# Patient Record
Sex: Female | Born: 1995 | Race: Black or African American | Hispanic: No | Marital: Single | Smoking: Never smoker
Health system: Southern US, Community
[De-identification: ages and names within clinical notes are randomized; demographics above are authoritative.]

## PROBLEM LIST (undated history)

## (undated) ENCOUNTER — Inpatient Hospital Stay (HOSPITAL_COMMUNITY): Payer: Self-pay

## (undated) DIAGNOSIS — D649 Anemia, unspecified: Secondary | ICD-10-CM

## (undated) HISTORY — PX: NO PAST SURGERIES: SHX2092

## (undated) HISTORY — DX: Anemia, unspecified: D64.9

---
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
## 2017-09-19 NOTE — L&D Delivery Note (Signed)
Delivery Note At 2314 a viable female was delivered via  (Presentation: ROA ;  ).  APGAR: 8, 9; weight pending.   Placenta status: spontaneous, complete.  Cord: three vessels  with the following complications: loose nuchal.  Cord pH: not drawn   Anesthesia:  Epidural Episiotomy:  None Lacerations:  None Suture Repair: n/a Est. Blood Loss (mL):  179  Mom to postpartum.  Baby to Couplet care / Skin to Skin.  Janeece RiggersEllis K Ozell Juhasz 02/17/2018, 11:31 PM

## 2017-09-29 ENCOUNTER — Inpatient Hospital Stay (HOSPITAL_COMMUNITY)
Admission: AD | Admit: 2017-09-29 | Discharge: 2017-09-29 | Disposition: A | Discharge status: Home / Self Care | Payer: Medicaid Other | Source: Ambulatory Visit | Attending: Obstetrics & Gynecology | Admitting: Obstetrics & Gynecology

## 2017-09-29 ENCOUNTER — Encounter (HOSPITAL_COMMUNITY): Payer: Self-pay | Admitting: *Deleted

## 2017-09-29 ENCOUNTER — Inpatient Hospital Stay (HOSPITAL_COMMUNITY): Payer: Medicaid Other

## 2017-09-29 DIAGNOSIS — Z3687 Encounter for antenatal screening for uncertain dates: Secondary | ICD-10-CM | POA: Diagnosis not present

## 2017-09-29 DIAGNOSIS — R109 Unspecified abdominal pain: Secondary | ICD-10-CM | POA: Diagnosis not present

## 2017-09-29 DIAGNOSIS — O26892 Other specified pregnancy related conditions, second trimester: Secondary | ICD-10-CM | POA: Diagnosis not present

## 2017-09-29 DIAGNOSIS — R1084 Generalized abdominal pain: Secondary | ICD-10-CM | POA: Diagnosis present

## 2017-09-29 DIAGNOSIS — Z3201 Encounter for pregnancy test, result positive: Secondary | ICD-10-CM | POA: Insufficient documentation

## 2017-09-29 DIAGNOSIS — O26899 Other specified pregnancy related conditions, unspecified trimester: Secondary | ICD-10-CM

## 2017-09-29 DIAGNOSIS — Z3492 Encounter for supervision of normal pregnancy, unspecified, second trimester: Secondary | ICD-10-CM

## 2017-09-29 DIAGNOSIS — Z3A20 20 weeks gestation of pregnancy: Secondary | ICD-10-CM

## 2017-09-29 LAB — URINALYSIS, ROUTINE W REFLEX MICROSCOPIC
BILIRUBIN URINE: NEGATIVE
Glucose, UA: NEGATIVE mg/dL
HGB URINE DIPSTICK: NEGATIVE
KETONES UR: NEGATIVE mg/dL
Leukocytes, UA: NEGATIVE
Nitrite: NEGATIVE
PROTEIN: NEGATIVE mg/dL
SPECIFIC GRAVITY, URINE: 1.02 (ref 1.005–1.030)
pH: 5 (ref 5.0–8.0)

## 2017-09-29 LAB — CBC
HEMATOCRIT: 33.7 % — AB (ref 36.0–46.0)
Hemoglobin: 10.8 g/dL — ABNORMAL LOW (ref 12.0–15.0)
MCH: 27.2 pg (ref 26.0–34.0)
MCHC: 32 g/dL (ref 30.0–36.0)
MCV: 84.9 fL (ref 78.0–100.0)
Platelets: 306 10*3/uL (ref 150–400)
RBC: 3.97 MIL/uL (ref 3.87–5.11)
RDW: 13.4 % (ref 11.5–15.5)
WBC: 11.8 10*3/uL — ABNORMAL HIGH (ref 4.0–10.5)

## 2017-09-29 LAB — WET PREP, GENITAL
Sperm: NONE SEEN
Trich, Wet Prep: NONE SEEN
Yeast Wet Prep HPF POC: NONE SEEN

## 2017-09-29 LAB — HCG, QUANTITATIVE, PREGNANCY: hCG, Beta Chain, Quant, S: 42310 m[IU]/mL — ABNORMAL HIGH (ref <5)

## 2017-09-29 LAB — POCT PREGNANCY, URINE: PREG TEST UR: POSITIVE — AB

## 2017-09-29 MED ORDER — PRENATAL 27-0.8 MG PO TABS: 1.0000 | ORAL_TABLET | Freq: Every day | ORAL | 0 refills | Status: DC

## 2017-09-29 NOTE — Discharge Instructions (Signed)
Abdominal Pain During Pregnancy Abdominal pain is common in pregnancy. Most of the time, it does not cause harm. There are many causes of abdominal pain. Some causes are more serious than others and sometimes the cause is not known. Abdominal pain can be a sign that something is very wrong with the pregnancy or the pain may have nothing to do with the pregnancy. Always tell your health care provider if you have any abdominal pain. Follow these instructions at home:  Do not have sex or put anything in your vagina until your symptoms go away completely.  Watch your abdominal pain for any changes.  Get plenty of rest until your pain improves.  Drink enough fluid to keep your urine clear or pale yellow.  Take over-the-counter or prescription medicines only as told by your health care provider.  Keep all follow-up visits as told by your health care provider. This is important. Contact a health care provider if:  You have a fever.  Your pain gets worse or you have cramping.  Your pain continues after resting. Get help right away if:  You are bleeding, leaking fluid, or passing tissue from the vagina.  You have vomiting or diarrhea that does not go away.  You have painful or bloody urination.  You notice a decrease in your baby's movements.  You feel very weak or faint.  You have shortness of breath.  You develop a severe headache with abdominal pain.  You have abnormal vaginal discharge with abdominal pain. This information is not intended to replace advice given to you by your health care provider. Make sure you discuss any questions you have with your health care provider. Document Released: 09/05/2005 Document Revised: 06/16/2016 Document Reviewed: 04/04/2013 Elsevier Interactive Patient Education  2018 Elsevier Inc.   [place] [facility] Providers    [facility]       Phone: [phone]  [facility]  [place] Phone: [phone]  [facility] [place]  Phone: [phone]  [facility]  Phone: [phone]  [facility] [place]  Phone: [phone]  [facility]       Phone: [phone]  [facility]    Phone: [phone]   [facility]    Phone: [phone]  [facility]    Phone: [phone]  [facility]    Phone: [phone]  [facility]    Phone: [phone]  [facility]       Phone: [phone]   [facility]  Phone: [phone]  [facility]    Phone: [phone]  [facility]   Phone: [phone]  [facility]      Phone: [phone]  [facility]    Phone: [phone]  Safe Medications in Pregnancy   Acne: Benzoyl Peroxide Salicylic Acid  Backache/Headache: Tylenol: 2 regular strength every 4 hours OR              2 Extra strength every 6 hours  Colds/Coughs/Allergies: Benadryl (alcohol free) 25 mg every 6 hours as needed Breath right strips Claritin Cepacol throat lozenges Chloraseptic throat spray Cold-Eeze- up to three times per day Cough drops, alcohol free Flonase (by prescription only) Guaifenesin Mucinex Robitussin DM (plain only, alcohol free) Saline nasal spray/drops Sudafed (pseudoephedrine) & Actifed ** use only after [redacted] weeks gestation and if you  do not have high blood pressure Tylenol Vicks Vaporub Zinc lozenges Zyrtec   Constipation: Colace Ducolax suppositories Fleet enema Glycerin suppositories Metamucil Milk of magnesia Miralax Senokot Smooth move tea  Diarrhea: Kaopectate Imodium A-D  *NO pepto Bismol  Hemorrhoids: Anusol Anusol HC Preparation  H Tucks  Indigestion: Tums Maalox Mylanta Zantac  Pepcid  Insomnia: Benadryl (alcohol free) 25mg  every 6 hours as needed Tylenol PM Unisom, no Gelcaps  Leg Cramps: Tums MagGel  Nausea/Vomiting:  Bonine Dramamine Emetrol Ginger extract Sea bands Meclizine  Nausea medication to take during pregnancy:  Unisom (doxylamine succinate 25 mg tablets) Take one tablet daily at bedtime. If symptoms are not adequately controlled, the dose can be increased to a maximum recommended dose of two tablets daily (1/2 tablet in the morning, 1/2 tablet mid-afternoon and one at bedtime). Vitamin B6 100mg  tablets. Take one tablet twice a day (up to 200 mg per day).  Skin Rashes: Aveeno products Benadryl cream or 25mg  every 6 hours as needed Calamine Lotion 1% cortisone cream  Yeast infection: Gyne-lotrimin 7 Monistat 7   **If taking multiple medications, please check labels to avoid duplicating the same active ingredients **take medication as directed on the label ** Do not exceed 4000 mg of tylenol in 24 hours **Do not take medications that contain aspirin or ibuprofen

## 2017-09-29 NOTE — MAU Note (Signed)
Pt reports she has had abd cramping , LMP 08/09/2017

## 2017-09-29 NOTE — MAU Provider Note (Signed)
History     CSN: [id number]  Arrival date and time: 09/29/17 1739  Chief Complaint  Patient presents with  . Abdominal Pain  . Possible Pregnancy   HPI Alice [name] is a 22 y.o. G1P0 at 3565w0d who presents with generalized abdominal pain. She states the pain comes and goes and she rates it a 5/10, has not tried anything for the pain. She denies any vaginal bleeding or discharge. Denies any nausea, vomiting or diarrhea. She is unsure of her LMP but thinks it was middle of November  OB History    Gravida Para Term Preterm AB Living   1             SAB TAB Ectopic Multiple Live Births                  History reviewed. No pertinent past medical history.  History reviewed. No pertinent surgical history.  History reviewed. No pertinent family history.  Social History   Tobacco Use  . Smoking status: Not on file  Substance Use Topics  . Alcohol use: Not on file  . Drug use: Not on file    Allergies: No Known Allergies  No medications prior to admission.    Review of Systems  Constitutional: Negative.  Negative for fatigue and fever.  HENT: Negative.   Respiratory: Negative.  Negative for shortness of breath.   Cardiovascular: Negative.  Negative for chest pain.  Gastrointestinal: Positive for abdominal pain. Negative for constipation, diarrhea, nausea and vomiting.  Genitourinary: Negative.  Negative for dysuria, vaginal bleeding and vaginal discharge.  Neurological: Negative.  Negative for dizziness and headaches.   Physical Exam   Blood pressure 121/70, pulse 93, temperature 98.2 F (36.8 C), temperature source Oral, resp. rate 16, height 5' 3.5" (1.613 m), weight 121 lb (54.9 kg), last menstrual period 08/09/2017, SpO2 100 %.  Physical Exam  Nursing note and vitals reviewed. Constitutional: She is oriented to person, place, and time. She appears well-developed and well-nourished. No distress.  HENT:  Head: Normocephalic.  Eyes: Pupils are equal, round, and  reactive to light.  Cardiovascular: Normal rate, regular rhythm and normal heart sounds.  Respiratory: Effort normal and breath sounds normal. No respiratory distress.  GI: Soft. Bowel sounds are normal. She exhibits no distension. There is no tenderness.  Neurological: She is alert and oriented to person, place, and time.  Skin: Skin is warm and dry.  Psychiatric: She has a normal mood and affect. Her behavior is normal. Judgment and thought content normal.   Dilation: Closed Effacement (%): Thick Exam by:: caroline neil cnm   MAU Course  Procedures Results for orders placed or performed during the hospital encounter of 09/29/17 (from the past 24 hour(s))  Urinalysis, Routine w reflex microscopic     Status: Abnormal   Collection Time: 09/29/17  6:11 PM  Result Value Ref Range   Color, Urine YELLOW YELLOW   APPearance HAZY (A) CLEAR   Specific Gravity, Urine 1.020 1.005 - 1.030   pH 5.0 5.0 - 8.0   Glucose, UA NEGATIVE NEGATIVE mg/dL   Hgb urine dipstick NEGATIVE NEGATIVE   Bilirubin Urine NEGATIVE NEGATIVE   Ketones, ur NEGATIVE NEGATIVE mg/dL   Protein, ur NEGATIVE NEGATIVE mg/dL   Nitrite NEGATIVE NEGATIVE   Leukocytes, UA NEGATIVE NEGATIVE  Pregnancy, urine POC     Status: Abnormal   Collection Time: 09/29/17  6:16 PM  Result Value Ref Range   Preg Test, Ur POSITIVE (A) NEGATIVE  CBC  Status: Abnormal   Collection Time: 09/29/17  6:39 PM  Result Value Ref Range   WBC 11.8 (H) 4.0 - 10.5 K/uL   RBC 3.97 3.87 - 5.11 MIL/uL   Hemoglobin 10.8 (L) 12.0 - 15.0 g/dL   HCT 16.1 (L) 09.6 - 04.5 %   MCV 84.9 78.0 - 100.0 fL   MCH 27.2 26.0 - 34.0 pg   MCHC 32.0 30.0 - 36.0 g/dL   RDW 40.9 81.1 - 91.4 %   Platelets 306 150 - 400 K/uL  hCG, quantitative, pregnancy     Status: Abnormal   Collection Time: 09/29/17  6:39 PM  Result Value Ref Range   hCG, Beta Chain, Quant, S 42,310 (H) <5 mIU/mL  ABO/Rh     Status: None (Preliminary result)   Collection Time: 09/29/17  6:39  PM  Result Value Ref Range   ABO/RH(D) O POS      MDM UA, UPT CBC, HCG, ABO/Rh Wet prep and gc/chlamydia [place] MFM OB Limited- u/s shows IUP at 20 weeks with posterior placenta   Assessment and Plan   1. Normal IUP (intrauterine pregnancy) on prenatal ultrasound, second trimester   2. Abdominal pain affecting pregnancy    -Discharge home in stable condition -Rx for prenatal vitamins given to patient -Patient advised to follow-up with OB/GYN of choice to start prenatal care asap, list given -Patient may return to MAU as needed or if her condition were to change or worsen   Rolm Bookbinder CNM 09/29/2017, 7:31 PM

## 2017-09-30 LAB — ABO/RH: ABO/RH(D): O POS

## 2017-09-30 LAB — GC/CHLAMYDIA PROBE AMP (~~LOC~~) NOT AT ARMC
CHLAMYDIA, DNA PROBE: NEGATIVE
NEISSERIA GONORRHEA: NEGATIVE

## 2017-10-10 LAB — OB RESULTS CONSOLE ABO/RH: RH Type: POSITIVE

## 2017-10-10 LAB — OB RESULTS CONSOLE RUBELLA ANTIBODY, IGM: RUBELLA: IMMUNE

## 2017-10-10 LAB — OB RESULTS CONSOLE RPR: RPR: NONREACTIVE

## 2017-10-10 LAB — OB RESULTS CONSOLE GC/CHLAMYDIA
Chlamydia: NEGATIVE
GC PROBE AMP, GENITAL: NEGATIVE

## 2017-10-10 LAB — OB RESULTS CONSOLE ANTIBODY SCREEN: ANTIBODY SCREEN: NEGATIVE

## 2017-10-10 LAB — OB RESULTS CONSOLE HEPATITIS B SURFACE ANTIGEN: Hepatitis B Surface Ag: NEGATIVE

## 2017-10-10 LAB — OB RESULTS CONSOLE HIV ANTIBODY (ROUTINE TESTING): HIV: NONREACTIVE

## 2018-01-17 LAB — OB RESULTS CONSOLE GBS: GBS: POSITIVE

## 2018-02-16 ENCOUNTER — Encounter (HOSPITAL_COMMUNITY): Payer: Self-pay | Admitting: *Deleted

## 2018-02-16 ENCOUNTER — Other Ambulatory Visit: Payer: Self-pay | Admitting: Obstetrics and Gynecology

## 2018-02-16 ENCOUNTER — Telehealth (HOSPITAL_COMMUNITY): Payer: Self-pay | Admitting: *Deleted

## 2018-02-16 NOTE — Telephone Encounter (Signed)
Preadmission screen  

## 2018-02-17 ENCOUNTER — Inpatient Hospital Stay (HOSPITAL_COMMUNITY)
Admission: AD | Admit: 2018-02-17 | Discharge: 2018-02-19 | DRG: 807 | Disposition: A | Discharge status: Home / Self Care | Payer: Medicaid Other | Attending: Obstetrics and Gynecology | Admitting: Obstetrics and Gynecology

## 2018-02-17 ENCOUNTER — Inpatient Hospital Stay (HOSPITAL_COMMUNITY): Payer: Medicaid Other | Admitting: Anesthesiology

## 2018-02-17 ENCOUNTER — Other Ambulatory Visit: Payer: Self-pay

## 2018-02-17 ENCOUNTER — Encounter (HOSPITAL_COMMUNITY): Payer: Self-pay

## 2018-02-17 DIAGNOSIS — D649 Anemia, unspecified: Secondary | ICD-10-CM | POA: Diagnosis present

## 2018-02-17 DIAGNOSIS — O9902 Anemia complicating childbirth: Secondary | ICD-10-CM | POA: Diagnosis present

## 2018-02-17 DIAGNOSIS — Z3A40 40 weeks gestation of pregnancy: Secondary | ICD-10-CM

## 2018-02-17 DIAGNOSIS — Z880 Allergy status to penicillin: Secondary | ICD-10-CM

## 2018-02-17 DIAGNOSIS — O99824 Streptococcus B carrier state complicating childbirth: Secondary | ICD-10-CM | POA: Diagnosis present

## 2018-02-17 DIAGNOSIS — Z3483 Encounter for supervision of other normal pregnancy, third trimester: Secondary | ICD-10-CM | POA: Diagnosis present

## 2018-02-17 LAB — POCT FERN TEST: POCT Fern Test: NEGATIVE

## 2018-02-17 LAB — TYPE AND SCREEN
ABO/RH(D): O POS
Antibody Screen: NEGATIVE

## 2018-02-17 LAB — CBC
HCT: 33.9 % — ABNORMAL LOW (ref 36.0–46.0)
HEMOGLOBIN: 10.5 g/dL — AB (ref 12.0–15.0)
MCH: 24.9 pg — AB (ref 26.0–34.0)
MCHC: 31 g/dL (ref 30.0–36.0)
MCV: 80.3 fL (ref 78.0–100.0)
Platelets: 249 10*3/uL (ref 150–400)
RBC: 4.22 MIL/uL (ref 3.87–5.11)
RDW: 14.3 % (ref 11.5–15.5)
WBC: 10.1 10*3/uL (ref 4.0–10.5)

## 2018-02-17 MED ORDER — FENTANYL 2.5 MCG/ML BUPIVACAINE 1/10 % EPIDURAL INFUSION (WH - ANES)
14.0000 mL/h | INTRAMUSCULAR | Status: DC | PRN
  Administered 2018-02-17: 14 mL/h via EPIDURAL

## 2018-02-17 MED ORDER — LIDOCAINE HCL (PF) 1 % IJ SOLN
INTRAMUSCULAR | Status: DC | PRN
  Administered 2018-02-17: 5 mL via EPIDURAL

## 2018-02-17 MED ORDER — PHENYLEPHRINE 40 MCG/ML (10ML) SYRINGE FOR IV PUSH (FOR BLOOD PRESSURE SUPPORT): 80.0000 ug | PREFILLED_SYRINGE | INTRAVENOUS | Status: DC | PRN

## 2018-02-17 MED ORDER — FENTANYL 2.5 MCG/ML BUPIVACAINE 1/10 % EPIDURAL INFUSION (WH - ANES)
INTRAMUSCULAR | Status: AC
  Filled 2018-02-17: qty 100

## 2018-02-17 MED ORDER — OXYTOCIN 40 UNITS IN LACTATED RINGERS INFUSION - SIMPLE MED
2.5000 [IU]/h | INTRAVENOUS | Status: DC
  Filled 2018-02-17: qty 1000

## 2018-02-17 MED ORDER — PENICILLIN G POT IN DEXTROSE 60000 UNIT/ML IV SOLN
3.0000 10*6.[IU] | INTRAVENOUS | Status: DC
  Administered 2018-02-17: 3 10*6.[IU] via INTRAVENOUS
  Filled 2018-02-17 (×5): qty 50

## 2018-02-17 MED ORDER — FENTANYL CITRATE (PF) 100 MCG/2ML IJ SOLN
INTRAMUSCULAR | Status: AC
  Filled 2018-02-17: qty 2

## 2018-02-17 MED ORDER — EPHEDRINE 5 MG/ML INJ
10.0000 mg | INTRAVENOUS | Status: DC | PRN
  Filled 2018-02-17: qty 2

## 2018-02-17 MED ORDER — PHENYLEPHRINE 40 MCG/ML (10ML) SYRINGE FOR IV PUSH (FOR BLOOD PRESSURE SUPPORT)
80.0000 ug | PREFILLED_SYRINGE | INTRAVENOUS | Status: DC | PRN
  Filled 2018-02-17: qty 5

## 2018-02-17 MED ORDER — PHENYLEPHRINE 40 MCG/ML (10ML) SYRINGE FOR IV PUSH (FOR BLOOD PRESSURE SUPPORT)
PREFILLED_SYRINGE | INTRAVENOUS | Status: AC
  Filled 2018-02-17: qty 10

## 2018-02-17 MED ORDER — ONDANSETRON HCL 4 MG/2ML IJ SOLN: 4.0000 mg | Freq: Four times a day (QID) | INTRAMUSCULAR | Status: DC | PRN

## 2018-02-17 MED ORDER — LACTATED RINGERS IV SOLN: 500.0000 mL | INTRAVENOUS | Status: DC | PRN

## 2018-02-17 MED ORDER — FENTANYL CITRATE (PF) 100 MCG/2ML IJ SOLN
100.0000 ug | INTRAMUSCULAR | Status: DC | PRN
  Administered 2018-02-17: 100 ug via INTRAVENOUS

## 2018-02-17 MED ORDER — OXYCODONE-ACETAMINOPHEN 5-325 MG PO TABS: 1.0000 | ORAL_TABLET | ORAL | Status: DC | PRN

## 2018-02-17 MED ORDER — LIDOCAINE HCL (PF) 1 % IJ SOLN
30.0000 mL | INTRAMUSCULAR | Status: DC | PRN
  Filled 2018-02-17: qty 30

## 2018-02-17 MED ORDER — SOD CITRATE-CITRIC ACID 500-334 MG/5ML PO SOLN: 30.0000 mL | ORAL | Status: DC | PRN

## 2018-02-17 MED ORDER — ACETAMINOPHEN 325 MG PO TABS: 650.0000 mg | ORAL_TABLET | ORAL | Status: DC | PRN

## 2018-02-17 MED ORDER — EPHEDRINE 5 MG/ML INJ: 10.0000 mg | INTRAVENOUS | Status: DC | PRN

## 2018-02-17 MED ORDER — LACTATED RINGERS IV SOLN
500.0000 mL | Freq: Once | INTRAVENOUS | Status: AC
  Administered 2018-02-17: 500 mL via INTRAVENOUS

## 2018-02-17 MED ORDER — SODIUM CHLORIDE 0.9 % IV SOLN
5.0000 10*6.[IU] | Freq: Once | INTRAVENOUS | Status: AC
  Administered 2018-02-17: 5 10*6.[IU] via INTRAVENOUS
  Filled 2018-02-17: qty 5

## 2018-02-17 MED ORDER — OXYCODONE-ACETAMINOPHEN 5-325 MG PO TABS: 2.0000 | ORAL_TABLET | ORAL | Status: DC | PRN

## 2018-02-17 MED ORDER — OXYTOCIN BOLUS FROM INFUSION
500.0000 mL | Freq: Once | INTRAVENOUS | Status: AC
  Administered 2018-02-17: 500 mL via INTRAVENOUS

## 2018-02-17 MED ORDER — LACTATED RINGERS IV SOLN
INTRAVENOUS | Status: DC
  Administered 2018-02-17: 18:00:00 via INTRAVENOUS

## 2018-02-17 MED ORDER — DIPHENHYDRAMINE HCL 50 MG/ML IJ SOLN: 12.5000 mg | INTRAMUSCULAR | Status: DC | PRN

## 2018-02-17 MED ORDER — LACTATED RINGERS IV SOLN: 500.0000 mL | Freq: Once | INTRAVENOUS | Status: DC

## 2018-02-17 NOTE — Anesthesia Procedure Notes (Signed)
Epidural Patient location during procedure: OB Start time: 02/17/2018 8:48 PM End time: 02/17/2018 8:54 PM  Staffing Anesthesiologist: Shelton SilvasHollis, Keiondra Brookover D, MD Performed: anesthesiologist   Preanesthetic Checklist Completed: patient identified, site marked, surgical consent, pre-op evaluation, timeout performed, IV checked, risks and benefits discussed and monitors and equipment checked  Epidural Patient position: sitting Prep: ChloraPrep Patient monitoring: heart rate, continuous pulse ox and blood pressure Approach: midline Location: L3-L4 Injection technique: LOR saline  Needle:  Needle type: Tuohy  Needle gauge: 17 G Needle length: 9 cm Catheter type: closed end flexible Catheter size: 20 Guage Test dose: negative and 1.5% lidocaine  Assessment Events: blood not aspirated, injection not painful, no injection resistance and no paresthesia  Additional Notes LOR @ 4  Patient identified. Risks/Benefits/Options discussed with patient including but not limited to bleeding, infection, nerve damage, paralysis, failed block, incomplete pain control, headache, blood pressure changes, nausea, vomiting, reactions to medications, itching and postpartum back pain. Confirmed with bedside nurse the patient's most recent platelet count. Confirmed with patient that they are not currently taking any anticoagulation, have any bleeding history or any family history of bleeding disorders. Patient expressed understanding and wished to proceed. All questions were answered. Sterile technique was used throughout the entire procedure. Please see nursing notes for vital signs. Test dose was given through epidural catheter and negative prior to continuing to dose epidural or start infusion. Warning signs of high block given to the patient including shortness of breath, tingling/numbness in hands, complete motor block, or any concerning symptoms with instructions to call for help. Patient was given instructions on  fall risk and not to get out of bed. All questions and concerns addressed with instructions to call with any issues or inadequate analgesia.    Reason for block:procedure for pain

## 2018-02-17 NOTE — Anesthesia Preprocedure Evaluation (Signed)
Anesthesia Evaluation  Patient identified by MRN, date of birth, ID band Patient awake    Reviewed: Allergy & Precautions, Patient's Chart, lab work & pertinent test results  Airway Mallampati: I       Dental no notable dental hx.    Pulmonary neg pulmonary ROS,    Pulmonary exam normal        Cardiovascular negative cardio ROS Normal cardiovascular exam     Neuro/Psych negative neurological ROS     GI/Hepatic negative GI ROS,   Endo/Other  negative endocrine ROS  Renal/GU      Musculoskeletal   Abdominal   Peds  Hematology  (+) anemia ,   Anesthesia Other Findings   Reproductive/Obstetrics (+) Pregnancy                             Anesthesia Physical Anesthesia Plan  ASA: II  Anesthesia Plan: Epidural   Post-op Pain Management:    Induction:   PONV Risk Score and Plan:   Airway Management Planned: Natural Airway  Additional Equipment: None  Intra-op Plan:   Post-operative Plan:   Informed Consent: I have reviewed the patients History and Physical, chart, labs and discussed the procedure including the risks, benefits and alternatives for the proposed anesthesia with the patient or authorized representative who has indicated his/her understanding and acceptance.     Plan Discussed with:   Anesthesia Plan Comments: (Lab Results      Component                Value               Date                      WBC                      10.1                02/17/2018                HGB                      10.5 (L)            02/17/2018                HCT                      33.9 (L)            02/17/2018                MCV                      80.3                02/17/2018                PLT                      249                 02/17/2018           )        Anesthesia Quick Evaluation

## 2018-02-17 NOTE — Consult Note (Signed)
Neonatology Note:   Attendance at Delivery:    [facility] for Dr. Estanislado Pandyivard to attend this NSVD at term due to meconium-stained fluid. The mother is a G1P0 O pos, GBS positive with late PNC starting at 20 weeks. She was treated with Pen G > 4 hours before delivery and was afebrile. She also got 1 dose of IV fentanyl 4 hours before delivery. ROM 2 hours prior to delivery, fluid with thin meconium. There were a few late FHR decelerations with last pushes. Infant was bulb suctioned on her mother's abdomen, then began to cry with stimulation. Delayed cord clamping was done. Needed only minimal bulb suctioning. Ap 8/9. Lungs clear to ausc in DR. Infant is able to remain with her mother for skin to skin time under nursing supervision. Transferred to the care of Pediatrician.   Doretha Souhristie C. Lan Entsminger, MD

## 2018-02-17 NOTE — H&P (Addendum)
Alice [name] is a 22 y.o. female, G1P0, IUP at 40.1 weeks, presenting for Spontaneous labor. Pregnancy complicated by GBS positive, late entry to PNV at 20 weeks, and anemia, hgb 10.6 at NOB visit, not low enough for tx. Pt stated she started feeling regular contraction on 02/16/2018 around 1200, then today at 1400 she lost her mucous plug and felt a trickle, pt thought her water had broken. Pt endorses feeling contractions Q5M, regular, and able to pace breath through them, but painful.  Pt endorse + Fm. Denies vaginal bleeding.   There are no active problems to display for this patient.   Past Medical History:  Diagnosis Date  . Anemia      No current facility-administered medications on file prior to encounter.    Current Outpatient Medications on File Prior to Encounter  Medication Sig Dispense Refill  . Prenatal Vit-Fe Fumarate-FA (MULTIVITAMIN-PRENATAL) 27-0.8 MG TABS tablet Take 1 tablet by mouth daily at 12 noon. 30 each 0     No Known Allergies  History of present pregnancy: Pt Info/Preference:  Screening/Consents:  Labs:   EDD: Estimated Date of Delivery: 02/16/18  Establised: Patient's last menstrual period was 08/09/2017.  Anatomy Scan: Date: 22.3 Placenta Location: Posterior  Genetic Screen: Panoroma: Normal, BG AFP:  First Tri: Quad:  Office: [facility] First PNV: 21.4 Blood Type O/Positive/-- (01/22 0000)  Language: [place] Last PNV: 39.5 Rhogam    Flu Vaccine:  Declined   Antibody Negative (01/22 0000)  TDaP vaccine Decline   GTT: Early: N/A Third Trimester: Neg  Feeding Plan: Breast BTL: N/A Rubella: Immune (01/22 0000)  Contraception: Undecided VBAC: N/A RPR: Nonreactive (01/22 0000)   Circumcision: BG/NO   HBsAg: Negative (01/22 0000)  Pediatrician:  Undecided    HIV: Non-reactive (01/22 0000)   Prenatal Classes: None Additional [place]: None GBS: Positive (05/01 0000)(For PCN allergy, check sensitivities)       Chlamydia: Neg      GC: Neg  Support Person: Partner:  Basir   PAP: 2019 Neg      Hgb Electrophoresis:  AA  Pain Management:  Natural Labor   Hgb NOB: 10.6    28W: 10.1   OB History    Gravida  1   Para      Term      Preterm      AB      Living        SAB      TAB      Ectopic      Multiple      Live Births             Past Medical History:  Diagnosis Date  . Anemia    History reviewed. No pertinent surgical history. Family History: family history is not on file. Social History:  reports that she has never smoked. She has never used smokeless tobacco. She reports that she does not drink alcohol or use drugs.   Prenatal Transfer Tool  Maternal Diabetes: No Genetic Screening: Normal Maternal Ultrasounds/Referrals: Normal Fetal Ultrasounds or other Referrals:  None Maternal Substance Abuse:  No Significant Maternal Medications:  None Significant Maternal Lab Results: None  ROS:  Review of Systems  Gastrointestinal: Positive for abdominal pain and nausea.  All other systems reviewed and are negative.    Physical Exam: BP 125/82 (BP Location: Right Arm)   Pulse 100   Temp 98.6 F (37 C) (Oral)   Resp 19   Ht 5' 3.5" (1.613 m)  Wt 67.6 kg (149 lb)   LMP 08/09/2017   SpO2 100%   BMI 25.98 kg/m   Physical Exam  Constitutional: She is well-developed, well-nourished, and in no distress.  HENT:  Head: Normocephalic and atraumatic.  Eyes: Pupils are equal, round, and reactive to light. Conjunctivae are normal.  Neck: Normal range of motion. Neck supple.  Cardiovascular: Normal rate, regular rhythm and normal heart sounds.  Pulmonary/Chest: Effort normal.  Abdominal: Soft. Bowel sounds are normal.  Genitourinary:  Genitourinary Comments: Per RN; see SVE Uterus: gradia equal to dates. Soft during no contractions and moderate to palpate during contractions.  Deferred Vaginal exam/.      NST: FHR baseline 135 bpm, Variability: moderate, Accelerations:present, Decelerations:  Absent= Cat 1/Reactive UC:    irregular, every 4-5 minutes, lasting 1-2 mins SVE: PER RN SVE:   Dilation: 4.5 Effacement (%): 90 Station: -2 Exam by:: n druebbisch rn, vertex verified by bedside US.  Leopold's: Position vertex, EFW 7.5 via leopold's.    In-Pt labs: No results found for this or any previous visit (from the past 24 hour(s)).    Assessment/Plan: Alice [name] is a 22 y.o. female, G1P0, IUP at 40.1 weeks, presenting for spontaneous labor.   FWB: Cat 1 Fetal Tracing.   Plan: Admit to Birthing Suite per consult with Dr Estanislado Pandyivard Routine [facility] orders Pain med: Database administratoratural Labor, will continue to assess pts need PCN G for GBS prophylaxis Anticipate labor progression   Quran Vasco NP-C, CNM, MSN 02/17/2018, 5:14 PM

## 2018-02-17 NOTE — MAU Note (Signed)
Pt leaking fluid since 1400, contractions

## 2018-02-18 ENCOUNTER — Encounter (HOSPITAL_COMMUNITY): Payer: Self-pay

## 2018-02-18 LAB — RPR: RPR Ser Ql: NONREACTIVE

## 2018-02-18 LAB — CBC
HEMATOCRIT: 30.9 % — AB (ref 36.0–46.0)
HEMOGLOBIN: 9.7 g/dL — AB (ref 12.0–15.0)
MCH: 25 pg — ABNORMAL LOW (ref 26.0–34.0)
MCHC: 31.4 g/dL (ref 30.0–36.0)
MCV: 79.6 fL (ref 78.0–100.0)
Platelets: 223 10*3/uL (ref 150–400)
RBC: 3.88 MIL/uL (ref 3.87–5.11)
RDW: 14.3 % (ref 11.5–15.5)
WBC: 14.7 10*3/uL — ABNORMAL HIGH (ref 4.0–10.5)

## 2018-02-18 MED ORDER — BENZOCAINE-MENTHOL 20-0.5 % EX AERO
1.0000 "application " | INHALATION_SPRAY | CUTANEOUS | Status: DC | PRN
  Administered 2018-02-18: 1 via TOPICAL
  Filled 2018-02-18: qty 56

## 2018-02-18 MED ORDER — IBUPROFEN 600 MG PO TABS
600.0000 mg | ORAL_TABLET | Freq: Four times a day (QID) | ORAL | Status: DC
  Administered 2018-02-18 – 2018-02-19 (×3): 600 mg via ORAL
  Filled 2018-02-18 (×6): qty 1

## 2018-02-18 MED ORDER — COCONUT OIL OIL: 1.0000 "application " | TOPICAL_OIL | Status: DC | PRN

## 2018-02-18 MED ORDER — DIPHENHYDRAMINE HCL 25 MG PO CAPS: 25.0000 mg | ORAL_CAPSULE | Freq: Four times a day (QID) | ORAL | Status: DC | PRN

## 2018-02-18 MED ORDER — ONDANSETRON HCL 4 MG PO TABS
4.0000 mg | ORAL_TABLET | ORAL | Status: DC | PRN
Start: 2018-02-18 — End: 2018-02-19

## 2018-02-18 MED ORDER — WITCH HAZEL-GLYCERIN EX PADS: 1.0000 "application " | MEDICATED_PAD | CUTANEOUS | Status: DC | PRN

## 2018-02-18 MED ORDER — PRENATAL MULTIVITAMIN CH
1.0000 | ORAL_TABLET | Freq: Every day | ORAL | Status: DC
  Administered 2018-02-18 – 2018-02-19 (×2): 1 via ORAL
  Filled 2018-02-18 (×2): qty 1

## 2018-02-18 MED ORDER — ONDANSETRON HCL 4 MG/2ML IJ SOLN: 4.0000 mg | INTRAMUSCULAR | Status: DC | PRN

## 2018-02-18 MED ORDER — DIBUCAINE 1 % RE OINT: 1.0000 "application " | TOPICAL_OINTMENT | RECTAL | Status: DC | PRN

## 2018-02-18 MED ORDER — SIMETHICONE 80 MG PO CHEW: 80.0000 mg | CHEWABLE_TABLET | ORAL | Status: DC | PRN

## 2018-02-18 MED ORDER — SENNOSIDES-DOCUSATE SODIUM 8.6-50 MG PO TABS
2.0000 | ORAL_TABLET | ORAL | Status: DC
  Administered 2018-02-18: 2 via ORAL
  Filled 2018-02-18: qty 2

## 2018-02-18 MED ORDER — TETANUS-DIPHTH-ACELL PERTUSSIS 5-2.5-18.5 LF-MCG/0.5 IM SUSP: 0.5000 mL | Freq: Once | INTRAMUSCULAR | Status: DC

## 2018-02-18 MED ORDER — ACETAMINOPHEN 325 MG PO TABS: 650.0000 mg | ORAL_TABLET | ORAL | Status: DC | PRN

## 2018-02-18 MED ORDER — ZOLPIDEM TARTRATE 5 MG PO TABS: 5.0000 mg | ORAL_TABLET | Freq: Every evening | ORAL | Status: DC | PRN

## 2018-02-18 NOTE — Lactation Note (Signed)
This note was copied from a baby's chart. Lactation Consultation Note  Patient Name: Alice [name]: 02/18/2018 Reason for consult: Initial assessment;Primapara;1st time breastfeeding;Term  P1 mother whose infant is now 333 hours old.  Mother is breastfeeding as I arrived.  Infant is latched on the left breast in the cradle hold and actively sucking.  Lips are flanged and mother states no pain.  She feels like she has latched well since birth.  I reminded her to keep the infant's cheeks and nose in tight to the breast for a good latch as it appears the baby might be a little bit shallow.  Mother readjusted the position of her baby.  Encouraged to feed 8-12 times/24 hours or earlier if baby shows feeding cues.  Reviewed feeding cues.  Continue STS and emphasized that father can also do STS.  Reviewed hand expression after feedings and mother will collect any EBM she obtains and feed back to baby.  Mom made aware of O/P services, breastfeeding support groups, community resources, and our phone # for post-discharge questions. Mother will call for assistance as needed.  FIOB and grandfather present.  RN in room for assessment.   Maternal Data Formula Feeding for Exclusion: No Has patient been taught Hand Expression?: Yes Does the patient have breastfeeding experience prior to this delivery?: No  Feeding Feeding Type: Breast Fed Length of feed: 10 min(still feeding)  LATCH Score Latch: Grasps breast easily, tongue down, lips flanged, rhythmical sucking.  Audible Swallowing: None  Type of Nipple: Everted at rest and after stimulation  Comfort (Breast/Nipple): Soft / non-tender  Hold (Positioning): No assistance needed to correctly position infant at breast.  LATCH Score: 8  Interventions Interventions: Breast feeding basics reviewed;Assisted with latch;Skin to skin;Breast massage;Hand express;Position options;Support pillows;Adjust position;Breast  compression  Lactation Tools Discussed/Used     Consult Status Consult Status: Follow-up [name]: 02/19/18 Follow-up type: In-patient    Lupe Handley R Dezaray Shibuya 02/18/2018, 3:00 AM

## 2018-02-18 NOTE — Anesthesia Postprocedure Evaluation (Signed)
Anesthesia Post Note  Patient: Alice [name]  Procedure(s) Performed: AN AD HOC LABOR EPIDURAL     Patient location during evaluation: Mother Baby Anesthesia Type: Epidural Level of consciousness: awake and alert Pain management: pain level controlled Vital Signs Assessment: post-procedure vital signs reviewed and stable Respiratory status: spontaneous breathing, nonlabored ventilation and respiratory function stable Cardiovascular status: stable Postop Assessment: no headache, no backache, epidural receding and patient able to bend at knees Anesthetic complications: no    Last Vitals:  Vitals:   02/18/18 0250 02/18/18 0650  BP: (!) 135/95 115/79  Pulse: 86 88  Resp: 16 16  Temp: 36.9 C 37 C  SpO2: 100% 98%    Last Pain:  Vitals:   02/18/18 0650  TempSrc: Oral  PainSc: 0-No pain   Pain Goal: Patients Stated Pain Goal: 10 (02/17/18 1820)               Rica RecordsICKELTON,Vito Beg

## 2018-02-18 NOTE — Progress Notes (Signed)
Subjective: Postpartum Day 1: Vaginal delivery, no laceration Patient up ad lib, reports no syncope or dizziness. Feeding:  [place] Contraceptive plan:  Mini-Pill  Pt found resting in bed in stable condition justr finishing with breastfeeding, pt mother at bedside. Pt ambulating well, tolerating PO feeds, +passing gas, no problems urinating, pt denies cramps, not needing to take meds. Pt remains stable.   Objective: Vital signs in last 24 hours: Temp:  [98.3 F (36.8 C)-99.3 F (37.4 C)] 98.3 F (36.8 C) (06/02 1211) Pulse Rate:  [86-124] 98 (06/02 1211) Resp:  [15-19] 15 (06/02 1211) BP: (107-141)/(50-101) 111/63 (06/02 1211) SpO2:  [98 %-100 %] 98 % (06/02 1211) Weight:  [67.6 kg (149 lb)] 67.6 kg (149 lb) (06/01 1642)  Physical Exam:  General: alert, cooperative, appears stated age and no distress Lochia: appropriate Uterine Fundus: firm Perineum: healing well DVT Evaluation: No evidence of DVT seen on physical exam. Negative Homan's sign. No cords or calf tenderness. No significant calf/ankle edema. Generalized LE non-pitting edema.    CBC Latest Ref Rng & Units 02/18/2018 02/17/2018 09/29/2017  WBC 4.0 - 10.5 K/uL 14.7(H) 10.1 11.8(H)  Hemoglobin 12.0 - 15.0 g/dL 2.9(F9.7(L) 10.5(L) 10.8(L)  Hematocrit 36.0 - 46.0 % 30.9(L) 33.9(L) 33.7(L)  Platelets 150 - 400 K/uL 223 249 306     Assessment/Plan: Status post vaginal delivery day 1 Stable Continue current care. Plan for discharge tomorrow, Breastfeeding, Lactation consult, Social Work consult and Contraception Undecided, will review all options before d/c home.     Zacharey Jensen, JADECNM 02/18/2018, 2:55 PM

## 2018-02-19 MED ORDER — IBUPROFEN 600 MG PO TABS: 600.0000 mg | ORAL_TABLET | Freq: Four times a day (QID) | ORAL | 0 refills | Status: DC

## 2018-02-19 NOTE — Discharge Summary (Signed)
OB Discharge Summary     Patient Name: Alice [name] DOB: [birth year] MRN: [id number]  Date of admission: 02/17/2018 Delivering MD: Kenney HousemanPROTHERO, NANCY JEAN   Date of discharge: 02/19/2018  Admitting diagnosis: 40.1 WKS, CTXS, WATER BROKE Intrauterine pregnancy: 1459w1d     Secondary diagnosis:  Active Problems:   Normal labor   Normal spontaneous vaginal delivery  Additional problems: none     Discharge diagnosis: Term Pregnancy Delivered                                                                                                Post partum procedures:none  Augmentation: AROM  Complications: None  Hospital course:  Onset of Labor With Vaginal Delivery     22 y.o. yo G1P1001 at 2259w1d was admitted in Latent Labor on 02/17/2018. Patient had an uncomplicated labor course as follows:  Membrane Rupture Time/Date: 9:18 PM ,02/17/2018   Intrapartum Procedures: Episiotomy: None [1]                                         Lacerations:  None [1]  Patient had a delivery of a Viable infant. 02/17/2018  Information for the patient's newborn:  Claudette LawsWarren, Girl Younique [[id number]][[id number]]  Delivery Method: Vag-Spont    Pateint had an uncomplicated postpartum course.  She is ambulating, tolerating a regular diet, passing flatus, and urinating well. Patient is discharged home in stable condition on 02/19/18. Taking motrin for pain and relieved well.    Physical exam  Vitals:   02/18/18 0650 02/18/18 1211 02/18/18 1925 02/19/18 0639  BP: 115/79 111/63 121/82 121/87  Pulse: 88 98 88 79  Resp: 16 15 18 18   Temp: 98.6 F (37 C) 98.3 F (36.8 C)  98 F (36.7 C)  TempSrc: Oral Oral  Oral  SpO2: 98% 98%  99%  Weight:      Height:       General: alert, cooperative and no distress Lochia: appropriate Uterine Fundus: firm Vagina: Intact.  DVT Evaluation: No evidence of DVT seen on physical exam. Negative Homan's sign. No cords or calf tenderness. No significant calf/ankle edema. Labs: Lab Results   Component Value Date   WBC 14.7 (H) 02/18/2018   HGB 9.7 (L) 02/18/2018   HCT 30.9 (L) 02/18/2018   MCV 79.6 02/18/2018   PLT 223 02/18/2018   No flowsheet data found.  Discharge instruction: per After Visit Summary and "Baby and Me Booklet".  After visit meds:  Allergies as of 02/19/2018   No Known Allergies     Medication List    TAKE these medications   ibuprofen 600 MG tablet Commonly known as:  ADVIL,MOTRIN Take 1 tablet (600 mg total) by mouth every 6 (six) hours.   IRON PO Take 1 tablet by mouth daily.   multivitamin-prenatal 27-0.8 MG Tabs tablet Take 1 tablet by mouth daily at 12 noon.       Diet: routine diet  Activity: Advance as tolerated. Pelvic rest for 6 weeks.   Outpatient  follow up:6 weeks Follow up Appt:No future appointments. Follow up Visit:No follow-ups on file.  Postpartum contraception: Nexplanon @ 6weeks PPV  Newborn Data: Live born female  Birth Weight: 7 lb 10.2 oz (3464 g) APGAR: 8, 9  Newborn Delivery   Birth date/time:  02/17/2018 23:14:00 Delivery type:  Vaginal, Spontaneous     Baby Feeding: Breast Disposition:home with mother  Pt d/c home in stable condition    02/19/2018 Dale Mendon, FNP

## 2018-02-19 NOTE — Lactation Note (Signed)
This note was copied from a baby's chart. Lactation Consultation Note  Patient Name: Alice [name] Alice [name]: 02/19/2018 Reason for consult: Follow-up assessment Mom reports feedings are going well.  Baby cluster fed some yesterday.  Reviewed milk coming to volume and engorgement treatment.  Mom has a breast pump at home.  Questions about burping baby answered.  Lactation outpatient services and support information reviewed and encouraged prn.  Maternal Data    Feeding Feeding Type: Breast Fed Length of feed: 20 min  LATCH Score                   Interventions    Lactation Tools Discussed/Used     Consult Status Consult Status: Complete Follow-up type: Call as needed    Huston FoleyMOULDEN, Zakk Borgen S 02/19/2018, 8:51 AM

## 2018-02-25 ENCOUNTER — Inpatient Hospital Stay (HOSPITAL_COMMUNITY): Admission: RE | Admit: 2018-02-25 | Payer: Medicaid Other | Source: Ambulatory Visit

## 2022-06-22 ENCOUNTER — Ambulatory Visit: Payer: Medicaid Other

## 2022-07-06 ENCOUNTER — Other Ambulatory Visit: Payer: Self-pay

## 2022-07-06 ENCOUNTER — Ambulatory Visit (INDEPENDENT_AMBULATORY_CARE_PROVIDER_SITE_OTHER): Payer: Self-pay | Admitting: *Deleted

## 2022-07-06 VITALS — BP 118/78 | HR 98 | Ht 63.5 in | Wt 106.3 lb

## 2022-07-06 DIAGNOSIS — Z3200 Encounter for pregnancy test, result unknown: Secondary | ICD-10-CM

## 2022-07-06 DIAGNOSIS — Z3201 Encounter for pregnancy test, result positive: Secondary | ICD-10-CM

## 2022-07-06 LAB — POCT PREGNANCY, URINE: Preg Test, Ur: POSITIVE — AB

## 2022-07-06 MED ORDER — GLYCOPYRROLATE 1 MG PO TABS: 1.0000 mg | ORAL_TABLET | Freq: Three times a day (TID) | ORAL | 2 refills | Status: DC

## 2022-07-06 NOTE — Progress Notes (Signed)
Pt here for UPT which is positive. She reports G2P1001 and approximate LMP 05/02/22. She denies having vaginal bleeding or abdominal pain. Pt was advised to go to MAU for evaluation if these problems arise. She reports having nausea and excess saliva production. Dietary measures for relief of nausea discussed as well as she may try OTC Ginger chews. Gaylan Gerold, CNM in to see pt and prescribed Robinul. Pt states that she had [facility] @ [facility] with first pregnancy and plans to return to that practice for care with this pregnancy.

## 2022-08-01 LAB — OB RESULTS CONSOLE RPR: RPR: NONREACTIVE

## 2022-08-01 LAB — OB RESULTS CONSOLE RUBELLA ANTIBODY, IGM: Rubella: IMMUNE

## 2022-08-01 LAB — OB RESULTS CONSOLE HIV ANTIBODY (ROUTINE TESTING): HIV: NONREACTIVE

## 2022-08-01 LAB — OB RESULTS CONSOLE HGB/HCT, BLOOD: Hemoglobin: 10.6

## 2022-08-01 LAB — OB RESULTS CONSOLE HEPATITIS B SURFACE ANTIGEN: Hepatitis B Surface Ag: NEGATIVE

## 2022-08-01 LAB — HEPATITIS C ANTIBODY: HCV Ab: NEGATIVE

## 2022-08-05 ENCOUNTER — Other Ambulatory Visit: Payer: Self-pay | Admitting: Obstetrics and Gynecology

## 2022-08-05 DIAGNOSIS — O30009 Twin pregnancy, unspecified number of placenta and unspecified number of amniotic sacs, unspecified trimester: Secondary | ICD-10-CM

## 2022-08-08 ENCOUNTER — Other Ambulatory Visit: Payer: Self-pay

## 2022-08-08 ENCOUNTER — Emergency Department (HOSPITAL_COMMUNITY)
Admission: EM | Admit: 2022-08-08 | Discharge: 2022-08-08 | Disposition: A | Discharge status: Home / Self Care | Payer: Medicaid Other | Attending: Medical | Admitting: Medical

## 2022-08-08 ENCOUNTER — Encounter (HOSPITAL_COMMUNITY): Payer: Self-pay | Admitting: Emergency Medicine

## 2022-08-08 DIAGNOSIS — K0889 Other specified disorders of teeth and supporting structures: Secondary | ICD-10-CM | POA: Diagnosis present

## 2022-08-08 MED ORDER — AMOXICILLIN-POT CLAVULANATE 875-125 MG PO TABS: 1.0000 | ORAL_TABLET | Freq: Two times a day (BID) | ORAL | 0 refills | Status: AC

## 2022-08-08 NOTE — ED Triage Notes (Signed)
Patient here with complaint of right lower dental pain that started two days ago. Patient also reports being pregnant, EDD 01/20/2023.

## 2022-08-08 NOTE — ED Provider Notes (Signed)
[facility] EMERGENCY DEPARTMENT Provider Note   CSN: [id number] Arrival date & time: 08/08/22  4235     History  Chief Complaint  Patient presents with   Dental Pain    Alice [name] is a 26 y.o. female currently 3 months pregnant with twins per patient.  She reports as otherwise healthy no daily medication use.  Patient presented to the ER for evaluation of right lower dental pain ongoing x2 days, she denies any clear inciting event she describes pain as aching constant worsened with chewing on the right side improves with rest, pain does not radiate.  She reports that she was told by her OB/GYN that she is not allowed to take Tylenol to help with her pain so she has not tried any medications.  She denies fever, chills, facial swelling, difficulty swallowing or any additional concerns.  HPI     Home Medications Prior to Admission medications   Medication Sig Start Date End Date Taking? Authorizing Provider  amoxicillin-clavulanate (AUGMENTIN) 875-125 MG tablet Take 1 tablet by mouth every 12 (twelve) hours for 7 days. 08/08/22 08/15/22 Yes Harlene Salts A, PA-C  glycopyrrolate (ROBINUL) 1 MG tablet Take 1 tablet (1 mg total) by mouth 3 (three) times daily. 07/06/22   Bernerd Limbo, CNM  Prenatal Vit-Fe Fumarate-FA (MULTIVITAMIN-PRENATAL) 27-0.8 MG TABS tablet Take 1 tablet by mouth daily at 12 noon. Patient not taking: Reported on 07/06/2022 09/29/17   Rolm Bookbinder, CNM      Allergies    Patient has no known allergies.    Review of Systems   Review of Systems  Constitutional:  Negative for chills and fever.  HENT:  Positive for dental problem. Negative for facial swelling.      Physical Exam Updated Vital Signs BP 135/85   Pulse (!) 101   Temp 99.1 F (37.3 C) (Oral)   Resp 14   LMP 05/02/2022 (Approximate)   SpO2 100%  Physical Exam Constitutional:      General: She is not in acute distress.    Appearance: Normal appearance. She is  well-developed. She is not ill-appearing or diaphoretic.  HENT:     Head: Normocephalic and atraumatic.     Jaw: There is normal jaw occlusion.     Right Ear: External ear normal.     Left Ear: External ear normal.     Nose: Nose normal.     Mouth/Throat:     Comments: Several missing teeth.  Large dental caries present the right lower first bicuspid and first molar.  No surrounding gingival erythema or fluctuance.  No swelling.  The patient has normal phonation and is in control of secretions. No stridor.  Midline uvula without edema. Soft palate rises symmetrically.  No tonsillar erythema, swelling or exudates. Tongue protrusion is normal, floor of mouth is soft. No trismus. No creptius on neck palpation. No gingival erythema or fluctuance noted. Mucus membranes moist. No pallor noted.   Eyes:     General: Vision grossly intact. Gaze aligned appropriately.     Extraocular Movements: Extraocular movements intact.     Conjunctiva/sclera: Conjunctivae normal.     Pupils: Pupils are equal, round, and reactive to light.  Neck:     Trachea: Trachea and phonation normal. No tracheal tenderness or tracheal deviation.  Pulmonary:     Effort: Pulmonary effort is normal. No respiratory distress.     Breath sounds: Normal air entry.  Abdominal:     General: There is no distension.  Palpations: Abdomen is soft.     Tenderness: There is no abdominal tenderness. There is no guarding or rebound.  Musculoskeletal:        General: Normal range of motion.     Cervical back: Normal range of motion and neck supple.  Skin:    General: Skin is warm and dry.  Neurological:     Mental Status: She is alert.     GCS: GCS eye subscore is 4. GCS verbal subscore is 5. GCS motor subscore is 6.     Comments: Speech is clear and goal oriented, follows commands Major Cranial nerves without deficit, no facial droop Moves extremities without ataxia, coordination intact  Psychiatric:        Behavior: Behavior  normal.     ED Results / Procedures / Treatments   Labs (all labs ordered are listed, but only abnormal results are displayed) Labs Reviewed - No data to display  EKG None  Radiology No results found.  Procedures Procedures    Medications Ordered in ED Medications - No data to display  ED Course/ Medical Decision Making/ A&P                           Medical Decision Making 26 year old female who reports is currently 3 months pregnant with twins otherwise healthy noted medication use presented for evaluation of right lower dental pain.  On evaluation patient has 2 large dental caries present without surrounding gingival erythema or swelling.  There is no evidence for PTA, RPA, dental abscess, Ludwick's or other deep space infections.  There is no indication for imaging or further ER work-up at this time.  Patient will contact her OB/GYN to confirm that she can take Tylenol to help with her pain, suspect she may have mistaken the for ibuprofen that she cannot take.  She was started on Augmentin today for dental infection and to avoid development of dental abscess.  Patient is also going to call her OB/GYN today to confirm that they are okay if she takes Augmentin 2.  She is going to follow-up with her dentist for definitive dental care.  At time of discharge patient is well-appearing and in no acute distress, she states understanding of care plan and she is agreeable.  All questions were answered.  No indication for imaging, blood work or further ER work-up at this time.  Patient's heart rate minimally elevated at triage this has improved and is in the 90s on recheck during my exam.  Low suspicion for sepsis or other emergent pathologies.  Strict ER precautions discussed with patient.  Risk Prescription drug management.    At this time there does not appear to be any evidence of an acute emergency medical condition and the patient appears stable for discharge with appropriate  outpatient follow up. Diagnosis was discussed with patient who verbalizes understanding of care plan and is agreeable to discharge. I have discussed return precautions with patient who verbalizes understanding. Patient encouraged to follow-up with their PCP, OB/GYN and dentist. All questions answered.  Patient's case discussed with Dr. Elpidio Anis who agrees with discharge with Augmentin at this time.  Note: Portions of this report may have been transcribed using voice recognition software. Every effort was made to ensure accuracy; however, inadvertent computerized transcription errors may still be present.         Final Clinical Impression(s) / ED Diagnoses Final diagnoses:  Pain, dental    Rx / DC Orders ED  Discharge Orders          Ordered    amoxicillin-clavulanate (AUGMENTIN) 875-125 MG tablet  Every 12 hours        08/08/22 0943              Bill Salinas, PA-C 08/08/22 1251    Mardene Sayer, MD 08/08/22 2052

## 2022-08-08 NOTE — Discharge Instructions (Addendum)
At this time there does not appear to be the presence of an emergent medical condition, however there is always the potential for conditions to change. Please read and follow the below instructions.  Please return to the Emergency Department immediately for any new or worsening symptoms or if your symptoms do not improve within 3 days. Please be sure to follow up with your Primary Care Provider within one week regarding your visit today; please call their office to schedule an appointment even if you are feeling better for a follow-up visit. Please call your OB/GYN today to double check to see if you can take Tylenol to help with your dental pain.  Also call them and tell them you are starting an antibiotic called Augmentin.  As long as your OB/GYN is okay with you taking Augmentin you may take it as prescribed to help with avoiding worsening dental infections.  Ultimately you need to see an dentist for your dental pain and cavities.  You may use a dental resource guide attached or call your dentist.   Please read the additional information packets attached to your discharge summary.  Go to the nearest Emergency Department immediately if: You have fever or chills You cannot open your mouth. You are having trouble breathing or swallowing. You have a fever. Your face, neck, or jaw is swollen. You have any new/concerning or worsening of symptoms  Do not take your medicine if  develop an itchy rash, swelling in your mouth or lips, or difficulty breathing; call 911 and seek immediate emergency medical attention if this occurs.  You may review your lab tests and imaging results in their entirety on your MyChart account.  Please discuss all results of fully with your primary care provider and other specialist at your follow-up visit.  Note: Portions of this text may have been transcribed using voice recognition software. Every effort was made to ensure accuracy; however, inadvertent computerized  transcription errors may still be present.

## 2022-08-17 LAB — OB RESULTS CONSOLE GC/CHLAMYDIA
Chlamydia: NEGATIVE
Neisseria Gonorrhea: NEGATIVE

## 2022-08-26 ENCOUNTER — Encounter: Payer: Self-pay | Admitting: *Deleted

## 2022-08-31 ENCOUNTER — Other Ambulatory Visit: Payer: Self-pay | Admitting: Obstetrics and Gynecology

## 2022-09-01 ENCOUNTER — Ambulatory Visit: Payer: Medicaid Other | Admitting: *Deleted

## 2022-09-01 ENCOUNTER — Ambulatory Visit: Payer: Medicaid Other | Attending: Obstetrics and Gynecology

## 2022-09-01 ENCOUNTER — Encounter: Payer: Self-pay | Admitting: *Deleted

## 2022-09-01 ENCOUNTER — Ambulatory Visit: Payer: Medicaid Other

## 2022-09-01 VITALS — BP 116/69 | HR 106

## 2022-09-01 DIAGNOSIS — Z363 Encounter for antenatal screening for malformations: Secondary | ICD-10-CM

## 2022-09-01 DIAGNOSIS — O30009 Twin pregnancy, unspecified number of placenta and unspecified number of amniotic sacs, unspecified trimester: Secondary | ICD-10-CM | POA: Insufficient documentation

## 2022-09-01 DIAGNOSIS — O30032 Twin pregnancy, monochorionic/diamniotic, second trimester: Secondary | ICD-10-CM

## 2022-09-01 DIAGNOSIS — Z3A18 18 weeks gestation of pregnancy: Secondary | ICD-10-CM | POA: Diagnosis not present

## 2022-09-02 ENCOUNTER — Other Ambulatory Visit: Payer: Self-pay | Admitting: *Deleted

## 2022-09-02 DIAGNOSIS — O30032 Twin pregnancy, monochorionic/diamniotic, second trimester: Secondary | ICD-10-CM

## 2022-09-16 ENCOUNTER — Ambulatory Visit: Payer: Medicaid Other | Attending: Maternal & Fetal Medicine

## 2022-09-16 ENCOUNTER — Other Ambulatory Visit: Payer: Self-pay | Admitting: *Deleted

## 2022-09-16 ENCOUNTER — Encounter: Payer: Self-pay | Admitting: *Deleted

## 2022-09-16 ENCOUNTER — Ambulatory Visit: Payer: Medicaid Other | Admitting: *Deleted

## 2022-09-16 ENCOUNTER — Ambulatory Visit: Payer: Medicaid Other | Attending: Obstetrics and Gynecology | Admitting: Obstetrics and Gynecology

## 2022-09-16 DIAGNOSIS — Z3A20 20 weeks gestation of pregnancy: Secondary | ICD-10-CM

## 2022-09-16 DIAGNOSIS — O30032 Twin pregnancy, monochorionic/diamniotic, second trimester: Secondary | ICD-10-CM

## 2022-09-16 DIAGNOSIS — O30012 Twin pregnancy, monochorionic/monoamniotic, second trimester: Secondary | ICD-10-CM

## 2022-09-16 NOTE — Progress Notes (Signed)
Maternal-Fetal Medicine   Name: Alice [name] DOB: 02-14-1996 MRN: [id number] Referring Provider: Nigel Bridgeman, CNM  I had the pleasure of seeing [name] today at the [facility]. She is G2 P1001 at 20w 6d gestation with monochorionic-diamniotic twin pregnancy. Natural conception. She had fetal anatomy scan 2 weeks ago but had left before the physician could see her.  Obstetrical history significant for a term vaginal delivery in 2019 of a female infant weighing 7 pounds and 10 ounces at birth.  Her daughter is in good health. Patient does not have hypertension or diabetes.  She has iron deficiency anemia and takes iron supplements.  Ultrasound Twin A: Maternal left, breech presentation, fundal placenta.  Amniotic fluid is normal good fetal activity seen.  Fetal bladder is seen well.  Twin B: Maternal right, transverse lie and head to maternal right, fundal placenta.  Amniotic fluid is normal and good fetal activity seen.  Fetal bladder is seen well.  No evidence of twin-to-twin transfusion syndrome.   Monochorionic-diamniotic twin pregnancy: -I explained chorionicity and its implications. -Monochorionic twins have a higher rate of complications including miscarriages, congenital malformations, twin to twin transfusion syndrome(TTTS) (15%), selective growth restriction, and fetal demise of one or both twins. -Twin pregnancies are associated with increased likelihood of gestational diabetes, gestational hypertension, or preeclampsia, malpresentations, cesarean delivery and postpartum hemorrhage. -Preterm delivery is the most-common complication of twin pregnancies.  -I discussed ultrasound surveillance for TTTS every 2 weeks from 16 weeks until delivery. -I discussed timing and mode of delivery. We recommend delivery at 37 weeks in monochorionic twins to prevent the likelihood of stillbirth of one or both twins that is increased in monochorionic twins. Earlier delivery  may be indicated if this pregnancy is complicated by fetal growth restriction or other maternal complications. -In vertex/vertex presentations, vaginal delivery can be safely undertaken. In non-vertex presentation of twin A, we recommend cesarean delivery. If first twin is vertex, and the second twin is non-vertex, either cesarean delivery or vaginal delivery of first twin followed by internal podalic version of second twin may be performed (depends on obstetrician's experience and patient's preference). Low-dose aspirin delays or prevents preeclampsia. I recommended aspirin 81 mg daily till delivery.  Recommendations -Follow-up growth scan in 2 weeks. -TTTS surveillance ultrasound every 2 weeks. -Fetal growth assessment every 4 weeks. -Weekly antenatal testing from 32 weeks. -Recommend delivery at 37 weeks. Thank you for consultation.  If you have any questions or concerns, please contact me the [facility].  Consultation including face-to-face (more than 50%) counseling 30 minutes.

## 2022-10-03 ENCOUNTER — Ambulatory Visit: Payer: Medicaid Other | Admitting: *Deleted

## 2022-10-03 ENCOUNTER — Encounter: Payer: Self-pay | Admitting: *Deleted

## 2022-10-03 ENCOUNTER — Ambulatory Visit: Payer: Medicaid Other | Attending: Obstetrics

## 2022-10-03 DIAGNOSIS — O30032 Twin pregnancy, monochorionic/diamniotic, second trimester: Secondary | ICD-10-CM | POA: Diagnosis not present

## 2022-10-03 DIAGNOSIS — Z3A23 23 weeks gestation of pregnancy: Secondary | ICD-10-CM | POA: Diagnosis not present

## 2022-10-13 ENCOUNTER — Ambulatory Visit: Payer: Medicaid Other

## 2022-10-28 ENCOUNTER — Ambulatory Visit: Payer: Medicaid Other

## 2022-10-28 ENCOUNTER — Ambulatory Visit: Payer: Medicaid Other | Attending: Obstetrics and Gynecology

## 2022-11-29 ENCOUNTER — Other Ambulatory Visit: Payer: Self-pay

## 2022-11-29 ENCOUNTER — Encounter (HOSPITAL_COMMUNITY): Payer: Self-pay | Admitting: Obstetrics and Gynecology

## 2022-11-29 ENCOUNTER — Inpatient Hospital Stay (HOSPITAL_COMMUNITY)
Admission: AD | Admit: 2022-11-29 | Discharge: 2022-12-02 | DRG: 831 | Disposition: A | Discharge status: Other Acute Inpatient Hospital | Payer: Medicaid Other | Attending: Obstetrics and Gynecology | Admitting: Obstetrics and Gynecology

## 2022-11-29 ENCOUNTER — Inpatient Hospital Stay (HOSPITAL_BASED_OUTPATIENT_CLINIC_OR_DEPARTMENT_OTHER): Payer: Medicaid Other

## 2022-11-29 DIAGNOSIS — Z3A31 31 weeks gestation of pregnancy: Secondary | ICD-10-CM

## 2022-11-29 DIAGNOSIS — O42913 Preterm premature rupture of membranes, unspecified as to length of time between rupture and onset of labor, third trimester: Secondary | ICD-10-CM | POA: Diagnosis present

## 2022-11-29 DIAGNOSIS — O42113 Preterm premature rupture of membranes, onset of labor more than 24 hours following rupture, third trimester: Secondary | ICD-10-CM

## 2022-11-29 DIAGNOSIS — O43123 Velamentous insertion of umbilical cord, third trimester: Secondary | ICD-10-CM | POA: Diagnosis present

## 2022-11-29 DIAGNOSIS — O4693 Antepartum hemorrhage, unspecified, third trimester: Secondary | ICD-10-CM

## 2022-11-29 DIAGNOSIS — O321XX2 Maternal care for breech presentation, fetus 2: Secondary | ICD-10-CM | POA: Diagnosis present

## 2022-11-29 DIAGNOSIS — R000 Tachycardia, unspecified: Secondary | ICD-10-CM | POA: Diagnosis present

## 2022-11-29 DIAGNOSIS — O99891 Other specified diseases and conditions complicating pregnancy: Secondary | ICD-10-CM | POA: Diagnosis present

## 2022-11-29 DIAGNOSIS — O4593 Premature separation of placenta, unspecified, third trimester: Secondary | ICD-10-CM | POA: Diagnosis not present

## 2022-11-29 DIAGNOSIS — O30033 Twin pregnancy, monochorionic/diamniotic, third trimester: Secondary | ICD-10-CM | POA: Diagnosis not present

## 2022-11-29 DIAGNOSIS — O42919 Preterm premature rupture of membranes, unspecified as to length of time between rupture and onset of labor, unspecified trimester: Secondary | ICD-10-CM

## 2022-11-29 DIAGNOSIS — Z3A32 32 weeks gestation of pregnancy: Secondary | ICD-10-CM | POA: Diagnosis not present

## 2022-11-29 LAB — CBC
HCT: 33.7 % — ABNORMAL LOW (ref 36.0–46.0)
Hemoglobin: 11 g/dL — ABNORMAL LOW (ref 12.0–15.0)
MCH: 28 pg (ref 26.0–34.0)
MCHC: 32.6 g/dL (ref 30.0–36.0)
MCV: 85.8 fL (ref 80.0–100.0)
Platelets: 256 10*3/uL (ref 150–400)
RBC: 3.93 MIL/uL (ref 3.87–5.11)
RDW: 12.4 % (ref 11.5–15.5)
WBC: 14.1 10*3/uL — ABNORMAL HIGH (ref 4.0–10.5)
nRBC: 0 % (ref 0.0–0.2)

## 2022-11-29 LAB — APTT: aPTT: 31 seconds (ref 24–36)

## 2022-11-29 LAB — WET PREP, GENITAL
Clue Cells Wet Prep HPF POC: NONE SEEN
Sperm: NONE SEEN
Trich, Wet Prep: NONE SEEN
WBC, Wet Prep HPF POC: <10 (ref <10)
Yeast Wet Prep HPF POC: NONE SEEN

## 2022-11-29 LAB — PROTIME-INR
INR: 1.1 (ref 0.8–1.2)
Prothrombin Time: 14 seconds (ref 11.4–15.2)

## 2022-11-29 LAB — GROUP B STREP BY PCR: Group B strep by PCR: NEGATIVE

## 2022-11-29 LAB — FIBRINOGEN: Fibrinogen: 522 mg/dL — ABNORMAL HIGH (ref 210–475)

## 2022-11-29 LAB — PREPARE RBC (CROSSMATCH)

## 2022-11-29 MED ORDER — ONDANSETRON HCL 4 MG/2ML IJ SOLN: 4.0000 mg | Freq: Four times a day (QID) | INTRAMUSCULAR | Status: DC | PRN

## 2022-11-29 MED ORDER — ZOLPIDEM TARTRATE 5 MG PO TABS: 5.0000 mg | ORAL_TABLET | Freq: Every evening | ORAL | Status: DC | PRN

## 2022-11-29 MED ORDER — MAGNESIUM SULFATE 40 GM/1000ML IV SOLN
2.0000 g/h | INTRAVENOUS | Status: DC
  Administered 2022-11-29 – 2022-11-30 (×3): 2 g/h via INTRAVENOUS
  Filled 2022-11-29 (×3): qty 1000

## 2022-11-29 MED ORDER — CALCIUM CARBONATE ANTACID 500 MG PO CHEW: 2.0000 | CHEWABLE_TABLET | ORAL | Status: DC | PRN

## 2022-11-29 MED ORDER — ACETAMINOPHEN 325 MG PO TABS: 650.0000 mg | ORAL_TABLET | ORAL | Status: DC | PRN

## 2022-11-29 MED ORDER — OXYCODONE-ACETAMINOPHEN 5-325 MG PO TABS: 1.0000 | ORAL_TABLET | ORAL | Status: DC | PRN

## 2022-11-29 MED ORDER — OXYTOCIN-SODIUM CHLORIDE 30-0.9 UT/500ML-% IV SOLN: 2.5000 [IU]/h | INTRAVENOUS | Status: DC

## 2022-11-29 MED ORDER — MAGNESIUM SULFATE BOLUS VIA INFUSION
4.0000 g | Freq: Once | INTRAVENOUS | Status: AC
  Administered 2022-11-29: 4 g via INTRAVENOUS
  Filled 2022-11-29: qty 1000

## 2022-11-29 MED ORDER — AZITHROMYCIN 250 MG PO TABS
1000.0000 mg | ORAL_TABLET | Freq: Once | ORAL | Status: AC
  Administered 2022-11-29: 1000 mg via ORAL
  Filled 2022-11-29: qty 4

## 2022-11-29 MED ORDER — DOCUSATE SODIUM 100 MG PO CAPS
100.0000 mg | ORAL_CAPSULE | Freq: Every day | ORAL | Status: DC
  Administered 2022-12-01 – 2022-12-02 (×2): 100 mg via ORAL
  Filled 2022-11-29 (×3): qty 1

## 2022-11-29 MED ORDER — LACTATED RINGERS IV SOLN: INTRAVENOUS | Status: DC

## 2022-11-29 MED ORDER — LACTATED RINGERS IV SOLN
125.0000 mL/h | INTRAVENOUS | Status: AC
  Administered 2022-11-29: 75 mL/h via INTRAVENOUS

## 2022-11-29 MED ORDER — LACTATED RINGERS IV SOLN: 500.0000 mL | INTRAVENOUS | Status: DC | PRN

## 2022-11-29 MED ORDER — LIDOCAINE HCL (PF) 1 % IJ SOLN: 30.0000 mL | INTRAMUSCULAR | Status: DC | PRN

## 2022-11-29 MED ORDER — SOD CITRATE-CITRIC ACID 500-334 MG/5ML PO SOLN: 30.0000 mL | ORAL | Status: DC | PRN

## 2022-11-29 MED ORDER — SODIUM CHLORIDE 0.9 % IV SOLN
2.0000 g | Freq: Four times a day (QID) | INTRAVENOUS | Status: AC
  Administered 2022-11-29 – 2022-12-01 (×8): 2 g via INTRAVENOUS
  Filled 2022-11-29 (×8): qty 2000

## 2022-11-29 MED ORDER — PRENATAL MULTIVITAMIN CH
1.0000 | ORAL_TABLET | Freq: Every day | ORAL | Status: DC
  Administered 2022-11-30 – 2022-12-02 (×3): 1 via ORAL
  Filled 2022-11-29 (×3): qty 1

## 2022-11-29 MED ORDER — OXYTOCIN BOLUS FROM INFUSION: 333.0000 mL | Freq: Once | INTRAVENOUS | Status: DC

## 2022-11-29 MED ORDER — OXYCODONE-ACETAMINOPHEN 5-325 MG PO TABS: 2.0000 | ORAL_TABLET | ORAL | Status: DC | PRN

## 2022-11-29 MED ORDER — AMOXICILLIN 500 MG PO CAPS
500.0000 mg | ORAL_CAPSULE | Freq: Three times a day (TID) | ORAL | Status: DC
  Administered 2022-12-01 – 2022-12-02 (×4): 500 mg via ORAL
  Filled 2022-11-29 (×4): qty 1

## 2022-11-29 MED ORDER — BETAMETHASONE SOD PHOS & ACET 6 (3-3) MG/ML IJ SUSP
12.0000 mg | INTRAMUSCULAR | Status: AC
  Administered 2022-11-29 – 2022-11-30 (×2): 12 mg via INTRAMUSCULAR
  Filled 2022-11-29 (×2): qty 5

## 2022-11-29 MED ORDER — SODIUM CHLORIDE 0.9% IV SOLUTION: Freq: Once | INTRAVENOUS | Status: DC

## 2022-11-29 MED ORDER — FENTANYL CITRATE (PF) 100 MCG/2ML IJ SOLN: 50.0000 ug | INTRAMUSCULAR | Status: DC | PRN

## 2022-11-29 NOTE — Consult Note (Signed)
Neonatology Consult to Antenatal Patient:  I was asked by Dr. Mancel Bale to see this patient in order to provide antenatal counseling due to prematurity.  Alice [name] was admitted today 11/29/22 at 31.[redacted] weeks GA with mono-di twins. She is currently not having active labor but did spontaneously rupture this am. No bleeding or infection concerns.  Good fetal well-being.  She is getting BMZ, Mag and latency abx.  Twin B is breech.  Mother just recently moved from [facility] to [facility] for watchful management.   I spoke with the patient and her GM. We discussed the worst case of delivery in the next 1-2 days, including usual DR management, possible respiratory complications and need for support, IV access, feedings (mother desires breast feeding, which was encouraged, and donor breast milk was discussed to which she expressed agreement to), LOS, Mortality and Morbidity, and long term outcomes. She did have the usual questions at this time which I was able to answer. I/we would be glad to come back if she has more questions later.    Thank you for asking me to see this patient.  Alice Sabal Katherina Mires, MD Neonatologist  The total length of face-to-face or floor/unit time for this encounter was 25 minutes. Counseling and/or coordination of care was 40 minutes of the above.

## 2022-11-29 NOTE — Progress Notes (Signed)
Labor Progress Note  Subjective: Alice [name], 27 y.o., G2P1001, with an IUP @ [redacted]w[redacted]d presented for  Patient Active Problem List   Diagnosis Date Noted   Preterm labor 11/29/2022   Normal spontaneous vaginal delivery 02/18/2018   Normal labor 02/17/2018    Objective: BP 109/73   Pulse (!) 123   Temp 98.6 F (37 C) (Axillary)   Resp 18   Ht '5\' 2"'$  (1.575 m)   Wt 69.5 kg   LMP 05/02/2022 (Approximate)   SpO2 97%   BMI 28.02 kg/m  No intake/output data recorded. Total I/O In: [id number][P.O.:834; I.V.:644] Out: 1251 [Urine:1251] NST: FHR baseline 135 A& B bpm, Variability: moderate, Accelerations:present, Decelerations:  Absent= Cat 1/Reactive CTX:  irregular, every 1 in 10 minutes Uterus gravid, soft non tender, moderate to palpate with contractions.   Deferred cervical check r/t cxt have spaced out.  SVE:  Dilation: 1.5 Effacement (%): 60 Station: -3 [facility] Bloody amniotic fluid still noted and pad count of 1 pad 50% covered over last two hours.   Assessment:  Alice [name], a  27y.o. G2P1001 at 374w3dith Monochorionic diamniotic twin pregnancy presented to MAU with complaints of SROM since 8am this morning. Patient states she felt a small gush and noted clear fluid. She reports getting up and felt 2 larger gushes and noted some "pinkish red fluid". She reports occasional contractions that feel "pinch". Currently rating pain a 4-6/10. She endorses positive fetal movement. Pt was evaluated in the MAU and EMTALA form signed [facility], EMTALA was resented r/t NICU informed us[place]af discharges and pt could stay here. Pt continues ot feels cxt in MAU, cxt have spaced since starting mag, no cervical change noted, but increase dark red vaginal bleeding noted on pad. Baby A vxt, 4lbs, B 3lbs and breech, discussed PCS versus Vaginal, with R/B/A of head entrapment of fetus B if delivery eminent, pt at this time desires  vaginal delivery. Wet Prep WNL. BMZ given @ 1054, ampicillin started at 1109, azitho PO at 1109, mag 4gm started at 1020, all on 3/12.  Pt had h/o anemia, hgb 10.6 at NOB, marginal cord insertion for twin B. Normal coags, fibrinogen 522 sightly elevated. Starting hgb 11.  Patient Active Problem List   Diagnosis Date Noted   Preterm labor 11/29/2022   Normal spontaneous vaginal delivery 02/18/2018   Normal labor 02/17/2018   NICHD: Category 1 X2 for A/B  Membranes: PROM  bloody fluid @ 0800 3/12 no s/s of infection  Pain management:               IV pain management: x PRN  Nitrous: PRN             Epidural placement: PRN  GBS Negative   Plan: Regular diet Continue latency antibiotics Transfer to [facility] for monitoring. Continuous monitoring BMZ 2nd dose tomorrow  Recommendations Per MFM: -Inpatient management until delivery -Complete course of antenatal corticosteroids -Daily fetal testing -Latency antibiotics -Delivery at around 34 weeks  -Delivery prior to 34 weeks would be indicated: should she go into spontaneous labor should she experience heavy vaginal bleeding should she show any signs of an intrauterine infection at any time for nonreassuring fetal status  Md RoMancel Baleware of plan and verbalized agreement.   JaVergennesFNP-C, PMHNP-BC  [address] [address]Cell: [phone] Phone: [phone]:  [phone] 11/29/2022  5:45 PM

## 2022-11-29 NOTE — MAU Provider Note (Signed)
History     CSN: [id number]  Arrival date and time: 11/29/22 [id number]   Event Date/Time   First Provider Initiated Contact with Patient 11/29/22 0920      Chief Complaint  Patient presents with   Rupture of [place] , a  27 y.o. G2P1001 at 52w3dpresents to MAU with complaints of SROM since 8am this morning. Patient states she felt a small gush and noted clear fluid. She reports getting up and felt 2 larger gushes and noted some "pinkish red fluid". She reports occasional contractions that feel "pinch". Currently rating pain a 4-6/10. She endorses positive fetal movement.         OB History     Gravida  2   Para  1   Term  1   Preterm      AB      Living  1      SAB      IAB      Ectopic      Multiple  0   Live Births  1           Past Medical History:  Diagnosis Date   Anemia     Past Surgical History:  Procedure Laterality Date   NO PAST SURGERIES      History reviewed. No pertinent family history.  Social History   Tobacco Use   Smoking status: Never   Smokeless tobacco: Never  Vaping Use   Vaping Use: Never used  Substance Use Topics   Alcohol use: Never   Drug use: Never    Allergies: No Known Allergies  Medications Prior to Admission  Medication Sig Dispense Refill Last Dose   ferrous sulfate 325 (65 FE) MG tablet Take 325 mg by mouth daily with breakfast.   11/28/2022   Prenatal Vit-Fe Fumarate-FA (MULTIVITAMIN-PRENATAL) 27-0.8 MG TABS tablet Take 1 tablet by mouth daily at 12 noon. 30 each 0 11/28/2022   glycopyrrolate (ROBINUL) 1 MG tablet Take 1 tablet (1 mg total) by mouth 3 (three) times daily. (Patient not taking: Reported on 09/01/2022) 90 tablet 2     Review of Systems  Constitutional:  Negative for chills, fatigue and fever.  Eyes:  Negative for pain and visual disturbance.  Respiratory:  Negative for apnea, shortness of breath and wheezing.   Cardiovascular:  Negative for chest pain and  palpitations.  Gastrointestinal:  Negative for abdominal pain, constipation, diarrhea, nausea and vomiting.  Genitourinary:  Positive for vaginal bleeding and vaginal discharge. Negative for difficulty urinating, dysuria, pelvic pain and vaginal pain.  Musculoskeletal:  Negative for back pain.  Neurological:  Negative for seizures, weakness and headaches.  Psychiatric/Behavioral:  Negative for suicidal ideas.    Physical Exam   Blood pressure 123/76, pulse (!) 114, temperature 98.4 F (36.9 C), temperature source Oral, resp. rate 18, height '5\' 2"'$  (1.575 m), weight 69.5 kg, last menstrual period 05/02/2022, SpO2 98 %, unknown if currently breastfeeding.  Physical Exam Vitals (Grossly ruptured) and nursing note reviewed.  Constitutional:      General: She is not in acute distress.    Appearance: Normal appearance.  HENT:     Head: Normocephalic.  Cardiovascular:     Rate and Rhythm: Normal rate and regular rhythm.  Pulmonary:     Effort: Pulmonary effort is normal.  Musculoskeletal:     Cervical back: Normal range of motion.  Skin:    General: Skin is warm and dry.     Capillary Refill:  Capillary refill takes 2 to 3 seconds.  Neurological:     Mental Status: She is alert and oriented to person, place, and time.  Psychiatric:        Mood and Affect: Mood normal.    FHT (A): 145 bpm with moderate variability.  FHT (B): 150 bpm with moderate variability  Toco: Contractions every 2-4 mins patient unaware.  MAU Course  Procedures Orders Placed This Encounter  Procedures   Group B strep by PCR   Wet prep, genital   Urine Culture   [place] MFM OB Follow Up   US MFM OB FOLLOW UP ADDL GEST   CBC   RPR   Protime-INR (coagulopathy lab panel)   APTT (coagulopathy lab panel)   Fibrinogen (coagulopathy lab panel)   OB RESULTS CONSOLE GC/Chlamydia   OB RESULTS CONSOLE RPR   OB RESULTS CONSOLE HIV antibody   OB RESULTS CONSOLE Rubella Antibody   OB RESULTS CONSOLE Hepatitis B surface  antigen   OB RESULTS CONSOLE Hemoglobin and hematocrit, blood   Diet regular Room service appropriate? Yes; Fluid consistency: Thin   Fetal monitoring every shift x 30 minutes   Continuous tocometry   Contraction - monitoring   Activity as tolerated   Patient may shower   Strict intake and output   Vitals signs per unit policy   Notify physician (specify)   Fetal monitoring per unit policy   Activity as tolerated   Cervical Exam   Measure blood pressure post delivery every 15 min x 1 hour then every 30 min x 1 hour   Fundal check post delivery every 15 min x 1 hour then every 30 min x 1 hour   Apply Labor & Delivery Care Plan   If Rapid HIV test positive or known HIV positive: initiate AZT orders   May in and out cath x 2 for inability to void   Insert urethral catheter X 1 PRN If Coude Catheter is chosen, qualified resources by campus can be found in the clinical skills nursing procedure for Coude Catheter 1. If straight catheterized > 2 times or patient unable to void post epidural plac...   Refer to Sidebar Report Urinary (Foley) Catheter Indications   Refer to Sidebar Report Post Indwelling Urinary Catheter Removal and Intervention Guidelines   Discontinue foley prior to vaginal delivery   Initiate Oral Care Protocol   Initiate Carrier Fluid Protocol   Informed Consent Details: Physician/Practitioner Attestation; Transcribe to consent form and obtain patient signature   Patient may have epidural placement upon request   Informed Consent Details: Physician/Practitioner Attestation; Transcribe to consent form and obtain patient signature   Notify physician (specify)   Vital signs   Defer vaginal exam for vaginal bleeding or PROM <37 weeks   Apply Antepartum Care Plan   Initiate Oral Care Protocol   Initiate Carrier Fluid Protocol   Informed Consent Details: Physician/Practitioner Attestation; Transcribe to consent form and obtain patient signature   Patient has an active order  for admit to inpatient/place in observation   Continuous tocometry   Fetal monitoring   Bed rest with bathroom privileges   Full code   Consult to neonatology Reason for Consult? Preterm labor and SROM   Consult to Maternal Fetal Care   Type and screen [place]   Prepare RBC (crossmatch)   Fetal nonstress test   Insert and maintain IV Line   Admit to Inpatient (patient's expected length of stay will be greater than 2 midnights or  inpatient only procedure)   Transfer patient   Results for orders placed or performed during the hospital encounter of 11/29/22 (from the past 24 hour(s))  Group B strep by PCR     Status: None   Collection Time: 11/29/22  9:52 AM   Specimen: Vaginal/Rectal; Genital  Result Value Ref Range   Group B strep by PCR NEGATIVE NEGATIVE  Type and screen [place]     Status: None (Preliminary result)   Collection Time: 11/29/22  9:52 AM  Result Value Ref Range   ABO/RH(D) O POS    Antibody Screen NEG    Sample Expiration      12/02/2022,2359 Performed at [facility], [place] [address] [place], [place] [address]    Unit Number [id number]    Blood Component Type RED CELLS,LR    Unit division 00    Status of Unit ALLOCATED    Transfusion Status OK TO TRANSFUSE    Crossmatch Result Compatible    Unit Number [id number]    Blood Component Type RED CELLS,LR    Unit division 00    Status of Unit ALLOCATED    Transfusion Status OK TO TRANSFUSE    Crossmatch Result Compatible   Wet prep, genital     Status: None   Collection Time: 11/29/22 10:17 AM   Specimen: Vaginal  Result Value Ref Range   Yeast Wet Prep HPF POC NONE SEEN NONE SEEN   Trich, Wet Prep NONE SEEN NONE SEEN   Clue Cells Wet Prep HPF POC NONE SEEN NONE SEEN   WBC, Wet Prep HPF POC <10 <10   Sperm NONE SEEN   CBC     Status: Abnormal   Collection Time: 11/29/22  1:08 PM  Result Value Ref Range   WBC 14.1 (H) 4.0 - 10.5 K/uL   RBC 3.93 3.87 -  5.11 MIL/uL   Hemoglobin 11.0 (L) 12.0 - 15.0 g/dL   HCT 33.7 (L) 36.0 - 46.0 %   MCV 85.8 80.0 - 100.0 fL   MCH 28.0 26.0 - 34.0 pg   MCHC 32.6 30.0 - 36.0 g/dL   RDW 12.4 11.5 - 15.5 %   Platelets 256 150 - 400 K/uL   nRBC 0.0 0.0 - 0.2 %  Protime-INR (coagulopathy lab panel)     Status: None   Collection Time: 11/29/22  1:08 PM  Result Value Ref Range   Prothrombin Time 14.0 11.4 - 15.2 seconds   INR 1.1 0.8 - 1.2  APTT (coagulopathy lab panel)     Status: None   Collection Time: 11/29/22  1:08 PM  Result Value Ref Range   aPTT 31 24 - 36 seconds  Fibrinogen (coagulopathy lab panel)     Status: Abnormal   Collection Time: 11/29/22  1:08 PM  Result Value Ref Range   Fibrinogen 522 (H) 210 - 475 mg/dL  Prepare RBC (crossmatch)     Status: None   Collection Time: 11/29/22  1:26 PM  Result Value Ref Range   Order Confirmation      ORDER PROCESSED BY BLOOD BANK Performed at [facility], [address], [place], [place] [address]      MDM - Fern positive  - Bedside US completed. Fetus A in cephalic presentation and fetus B in transverse lie.  - Call placed to Dr. Mancel Bale, on call MD for [place]. Reviewed patient current presentation and positive SROM in 31 weeks Twin A. NICU currently in the red and  patient will need to be transferred to another facility.  MD to send Midwife Luvenia Starch, CNM  down to take over care and manage patient.   Luvenia Starch, CNM in department 10 mins later. - Transfer of Care to [place], [place] 11/29/2022, 9:20 AM

## 2022-11-29 NOTE — Consult Note (Signed)
MFM Note  Alice [name] is a 27 year old gravida 2 para 1-0-0-1 currently at 31 weeks and 3 days with a monochorionic, diamniotic twin gestation.  She was seen in consultation at the request of Dr. Mancel Bale due [name] [facility].  The patient reports that she ruptured membranes spontaneously this morning after getting up from bed.  Rupture of membranes was confirmed in the MAU.  Currently, the fetal status of both twin A and twin B are reassuring.  Contractions q. 3 [name] 5 minutes are noted on the toco.  The patient reports that she is feeling contractions.    Due [name] PPROM at her current gestational age, she was started on magnesium sulfate for tocolysis and fetal neuroprotection.  She was also given betamethasone and latency antibiotics.  An ultrasound performed this morning showed an EFW for twin A of 4 pounds (47th percentile) and an EFW for twin B of 3 pounds 9 ounces (17th percentile).    The maximal vertical pocket for twin A was 2.1 cm and for twin B was 3.6 cm.    Twin A was in the vertex presentation.  Twin B is breech.  Due [name] the monochorionic twin gestation, she had a normal fetal echocardiogram with [facility] pediatric cardiology.  The usual management and implications of PPROM were discussed with the patient.    She was advised that due [name] rupture of membranes, she will require inpatient management until delivery with daily fetal testing.    She should receive a complete a course of latency antibiotics and antenatal corticosteroids.    The patient was advised that she will be treated with magnesium sulfate until she completes her 48-hour steroid course.  Due [name] PPROM, delivery is recommended at around 34 weeks.   Delivery prior [name] 34 weeks will be indicated: should she go into spontaneous labor should she experience heavy vaginal bleeding should she show any signs of an intrauterine infection at any time for nonreassuring fetal status  The patient was reassured that most babies delivered  at around 34 weeks following PPROM generally do well.  However, her baby will require a NICU admission following delivery.  We will continue [name] follow her closely with you throughout her hospital stay.  At the end of the consultation, the patient stated that all her questions had been answered [name] her complete satisfaction.    Recommendations: -Inpatient management until delivery -Complete course of antenatal corticosteroids -Daily fetal testing -Latency antibiotics -Delivery at around 34 weeks  -Delivery prior [name] 34 weeks would be indicated: should she go into spontaneous labor should she experience heavy vaginal bleeding should she show any signs of an intrauterine infection at any time for nonreassuring fetal status

## 2022-11-29 NOTE — MAU Note (Signed)
Alice [name] is a 27 y.o. at 23w3dhere in MAU reporting: she woke this morning leaking what was initially clear fluid, but fluid is now a darker red color.  Denies ctxs, but having pelvic pressure.  Reports +FM x2. LMP: NA Onset of complaint: 0800 Pain score: 6 Vitals:   11/29/22 0900  BP: 114/71  Pulse: (!) 117  Resp: 18  Temp: 98.4 F (36.9 C)  SpO2: 98%     [id number] orders placed from triage:   None

## 2022-11-29 NOTE — H&P (Signed)
Alice [name] , a  27 y.o. G2P1001 at 18w3dwith Monochorionic diamniotic twin pregnancy presented to MAU with complaints of SROM since 8am this morning. Patient states she felt a small gush and noted clear fluid. She reports getting up and felt 2 larger gushes and noted some "pinkish red fluid". She reports occasional contractions that feel "pinch". Currently rating pain a 4-6/10. She endorses positive fetal movement. Pt was evaluated in the MAU and EMTALA form signed [facility], EMTALA was resented r/t NICU informed [place] discharges and pt could stay here. Pt continues ot feels cxt in MAU, cxt have spaced since starting mag, no cervical change noted, but increase dark red vaginal bleeding noted on pad. Baby A vxt, 4lbs, B 3lbs and breech, discussed PCS versus Vaginal, with R/B/A of head entrapment of fetus B if delivery eminent, pt at this time desires vaginal delivery. Wet Prep WNL. BMZ given @ 1054, ampicillin started at 1109, azitho PO at 1109, mag 4gm started at 1020, all on 3/12.  Pt had h/o anemia, hgb 10.6 at NOB, marginal cord insertion for twin B.   Patient Active Problem List   Diagnosis Date Noted   Normal spontaneous vaginal delivery 02/18/2018   Normal labor 02/17/2018     Active Ambulatory Problems    Diagnosis Date Noted   Normal labor 02/17/2018   Normal spontaneous vaginal delivery 02/18/2018   Resolved Ambulatory Problems    Diagnosis Date Noted   No Resolved Ambulatory Problems   Past Medical History:  Diagnosis Date   Anemia       Medications Prior to Admission  Medication Sig Dispense Refill Last Dose   ferrous sulfate 325 (65 FE) MG tablet Take 325 mg by mouth daily with breakfast.   11/28/2022   Prenatal Vit-Fe Fumarate-FA (MULTIVITAMIN-PRENATAL) 27-0.8 MG TABS tablet Take 1 tablet by mouth daily at 12 noon. 30 each 0 11/28/2022   glycopyrrolate (ROBINUL) 1 MG tablet Take 1 tablet (1 mg total) by mouth 3 (three) times  daily. (Patient not taking: Reported on 09/01/2022) 90 tablet 2     Past Medical History:  Diagnosis Date   Anemia      No current facility-administered medications on file prior to encounter.   Current Outpatient Medications on File Prior to Encounter  Medication Sig Dispense Refill   ferrous sulfate 325 (65 FE) MG tablet Take 325 mg by mouth daily with breakfast.     Prenatal Vit-Fe Fumarate-FA (MULTIVITAMIN-PRENATAL) 27-0.8 MG TABS tablet Take 1 tablet by mouth daily at 12 noon. 30 each 0   glycopyrrolate (ROBINUL) 1 MG tablet Take 1 tablet (1 mg total) by mouth 3 (three) times daily. (Patient not taking: Reported on 09/01/2022) 90 tablet 2     No Known Allergies  History of present pregnancy: Pt Info/Preference:  Screening/Consents:  Labs:   EDD: Estimated Date of Delivery: 01/28/23  Establised: Patient's last menstrual period was 05/02/2022 (approximate).  Anatomy Scan: Date: 19.3 weeks Placenta Location: fundal with twin A being marginal  Genetic Screen: [place]or[place]:LR identical females AFP:  First Tri: Quad:  Office: [facility]ob            First PNV: 13 weeks Blood Type --/--/O POS (03/12 [id number]  Language: english Last PNV: 30.4 weeks Rhogam    Flu Vaccine:  declined   Antibody NEG (03/12 0952)  TDaP vaccine declined   GTT: Early: 4.8 Third Trimester: ???  Feeding Plan: Bottle, br BTL: no  Rubella:  Immune  Contraception: ??? VBAC: no RPR:   NR  Circumcision: N/A   HBsAg:  Neg  Pediatrician:  ???   HIV:   Neg  Prenatal Classes: no Additional [place]: Last EFW 10/03/2022 Avxt fundal placenta 1.3lbs 24%, 9% discordancy, twin B vxt fundal woith marginal cord insertion, 1.5lbs 50%.  GBS: NEGATIVE/-- (03/12 0952)(For PCN allergy, check sensitivities)       Chlamydia: Neg    MFM Referral/Consult:  GC: neg  Support Person: partner   PAP: ???  Pain Management: epidural Neonatologist Referral:  Hgb Electrophoresis:  AA  Birth Plan: Vaginal birth    Hgb NOB: 10.6    28W: ???   OB History      Gravida  2   Para  1   Term  1   Preterm      AB      Living  1      SAB      IAB      Ectopic      Multiple  0   Live Births  1          Past Medical History:  Diagnosis Date   Anemia    Past Surgical History:  Procedure Laterality Date   NO PAST SURGERIES     Family History: family history is not on file. Social History:  reports that she has never smoked. She has never used smokeless tobacco. She reports that she does not drink alcohol and does not use drugs.   Prenatal Transfer Tool  Maternal Diabetes: No Genetic Screening: Normal Maternal Ultrasounds/Referrals: Normal Fetal Ultrasounds or other Referrals:  Referred to Materal Fetal Medicine  Maternal Substance Abuse:  No Significant Maternal Medications:  None Significant Maternal Lab Results: Group B Strep negative  ROS:  Review of Systems  Constitutional: Negative.   HENT: Negative.    Eyes: Negative.   Respiratory: Negative.    Cardiovascular: Negative.   Gastrointestinal:  Positive for abdominal pain.  Genitourinary:        Vaginal bleeding light, noted with leakage of fluids   Musculoskeletal: Negative.   Skin: Negative.   Neurological: Negative.   Endo/Heme/Allergies: Negative.   Psychiatric/Behavioral: Negative.       Physical Exam: BP (!) 128/117 (BP Location: Right Arm)   Pulse (!) 126   Temp 99 F (37.2 C) (Oral)   Resp 18   Ht '5\' 2"'$  (1.575 m)   Wt 69.5 kg   LMP 05/02/2022 (Approximate)   SpO2 99%   BMI 28.02 kg/m   Physical Exam Vitals and nursing note reviewed. Exam conducted with a chaperone present.  Constitutional:      Appearance: Normal appearance.  HENT:     Head: Normocephalic and atraumatic.     Nose: Nose normal.     Mouth/Throat:     Mouth: Mucous membranes are moist.  Eyes:     Pupils: Pupils are equal, round, and reactive to light.  Cardiovascular:     Rate and Rhythm: Normal rate and regular rhythm.     Pulses: Normal pulses.     Heart sounds:  Normal heart sounds.  Pulmonary:     Effort: Pulmonary effort is normal.     Breath sounds: Normal breath sounds.  Abdominal:     General: Bowel sounds are normal.  Genitourinary:    Comments: Uterus gravida equal to dates. Pelvis adequate, vaginal bleeding noted on pad.  Musculoskeletal:        General: Normal range of motion.  Cervical back: Normal range of motion and neck supple.  Skin:    General: Skin is warm.     Capillary Refill: Capillary refill takes less than 2 seconds.  Neurological:     General: No focal deficit present.     Mental Status: She is alert.  Psychiatric:        Mood and Affect: Mood normal.      NST: FHR baseline A 150 (VXT) B 150 bpm (Breech), Variability: moderate, Accelerations:present, Decelerations:  Absent= Cat 1/Reactive UC:   irregular, every 2-4 minutes SVE:   Dilation: 1.5 Effacement (%): 60 Station: -3 Exam by:: [place], vertex verified by fetal sutures.  Leopold's: Position vxt: A, Breech: B, EFW 4.5lbs A, B4.5lbs via leopold's.   Labs: Results for orders placed or performed during the hospital encounter of 11/29/22 (from the past 24 hour(s))  Group B strep by PCR     Status: None   Collection Time: 11/29/22  9:52 AM   Specimen: Vaginal/Rectal; Genital  Result Value Ref Range   Group B strep by PCR NEGATIVE NEGATIVE  Type and screen [place]     Status: None   Collection Time: 11/29/22  9:52 AM  Result Value Ref Range   ABO/RH(D) O POS    Antibody Screen NEG    Sample Expiration      12/02/2022,2359 Performed at [facility], [place] [address], [place], [place] [address]   Wet prep, genital     Status: None   Collection Time: 11/29/22 10:17 AM   Specimen: Vaginal  Result Value Ref Range   Yeast Wet Prep HPF POC NONE SEEN NONE SEEN   Trich, Wet Prep NONE SEEN NONE SEEN   Clue Cells Wet Prep HPF POC NONE SEEN NONE SEEN   WBC, Wet Prep HPF POC <10 <10   Sperm NONE SEEN   Prepare RBC  (crossmatch)     Status: None   Collection Time: 11/29/22  1:26 PM  Result Value Ref Range   Order Confirmation      ORDER PROCESSED BY BLOOD BANK Performed at [facility], [place] [address] [place], [place] [address]     Imaging:  No results found.  MAU Course: Orders Placed This Encounter  Procedures   Group B strep by PCR   Wet prep, genital   Urine Culture   [place] MFM OB Follow Up   US MFM OB FOLLOW UP ADDL GEST   CBC   Protime-INR (coagulopathy lab panel)   APTT (coagulopathy lab panel)   Fibrinogen (coagulopathy lab panel)   Fetal monitoring every shift x 30 minutes   Continuous tocometry   Contraction - monitoring   Activity as tolerated   Patient may shower   Strict intake and output   Cervical Exam   Insert urethral catheter X 1 PRN If Coude Catheter is chosen, qualified resources by campus can be found in the clinical skills nursing procedure for Coude Catheter 1. If straight catheterized > 2 times or patient unable to void post epidural plac...   Informed Consent Details: Physician/Practitioner Attestation; Transcribe to consent form and obtain patient signature   Consult to neonatology Reason for Consult? Preterm labor and SROM   Consult to Maternal Fetal Care   Type and screen [place]   Prepare RBC (crossmatch)   Fetal nonstress test   Meds ordered this encounter  Medications   azithromycin (ZITHROMAX) tablet 1,000 mg   FOLLOWED BY Linked  Order Group    ampicillin (OMNIPEN) 2 g in sodium chloride 0.9 % 100 mL IVPB     Order Specific Question:   Antibiotic Indication:     Answer:   Other Indication (list below)     Order Specific Question:   Other Indication:     Answer:   pre-term labor prophylaxis    amoxicillin (AMOXIL) capsule 500 mg   magnesium bolus via infusion 4 g   magnesium sulfate 40 grams in SWI 1000 mL OB infusion   lactated ringers infusion   betamethasone acetate-betamethasone sodium phosphate (CELESTONE) injection  12 mg   0.9 %  sodium chloride infusion (Manually program via Guardrails IV Fluids)    Assessment/Plan: Alice [name] , a  27 y.o. G2P1001 at 73w3dwith Monochorionic diamniotic twin pregnancy presented to MAU with complaints of SROM since 8am this morning. Patient states she felt a small gush and noted clear fluid. She reports getting up and felt 2 larger gushes and noted some "pinkish red fluid". She reports occasional contractions that feel "pinch". Currently rating pain a 4-6/10. She endorses positive fetal movement. Pt was evaluated in the MAU and EMTALA form signed [facility], EMTALA was resented r/t NICU informed u[place]of discharges and pt could stay here. Pt continues ot feels cxt in MAU, cxt have spaced since starting mag, no cervical change noted, but increase dark red vaginal bleeding noted on pad. Baby A vxt, 4lbs, B 3lbs and breech, discussed PCS versus Vaginal, with R/B/A of head entrapment of fetus B if delivery eminent, pt at this time desires vaginal delivery. Wet Prep WNL. BMZ given @ 1054, ampicillin started at 1109, azitho PO at 1109, mag 4gm started at 1020, all on 3/12.  Pt had h/o anemia, hgb 10.6 at NOB, marginal cord insertion for twin B.   FWB: Cat 1 x2  Fetal Tracing.   No cervical change noted over last hours, but increase dark red water bleeding noted.   Plan: Admit to [facility] consult with Dr RMancel BaleRoutine [facility] orders Pain med/epidural prn PPROM: Latency ABX, azithromycin, ampicillin, f/by amox PO Start Mag 4gm bolus with 2gm/hr for neuroprotectant BMZ x2 doses GC/C/UC/Pending CBC/PT/PTT/Fibrinogen/T&S with 2 units crossed matched [place] progression   [place] FNP-C, PMHNP-BC  [address] [id number] Cell: [phone] Office Phone: [phone]: [phone]/08/2023  1:42 PM

## 2022-11-30 LAB — CBC WITH DIFFERENTIAL/PLATELET
Abs Immature Granulocytes: 0.16 10*3/uL — ABNORMAL HIGH (ref 0.00–0.07)
Basophils Absolute: 0 10*3/uL (ref 0.0–0.1)
Basophils Relative: 0 %
Eosinophils Absolute: 0 10*3/uL (ref 0.0–0.5)
Eosinophils Relative: 0 %
HCT: 31.6 % — ABNORMAL LOW (ref 36.0–46.0)
Hemoglobin: 9.9 g/dL — ABNORMAL LOW (ref 12.0–15.0)
Immature Granulocytes: 1 %
Lymphocytes Relative: 7 %
Lymphs Abs: 1.1 10*3/uL (ref 0.7–4.0)
MCH: 27.3 pg (ref 26.0–34.0)
MCHC: 31.3 g/dL (ref 30.0–36.0)
MCV: 87.3 fL (ref 80.0–100.0)
Monocytes Absolute: 1.3 10*3/uL — ABNORMAL HIGH (ref 0.1–1.0)
Monocytes Relative: 8 %
Neutro Abs: 12.7 10*3/uL — ABNORMAL HIGH (ref 1.7–7.7)
Neutrophils Relative %: 84 %
Platelets: 267 10*3/uL (ref 150–400)
RBC: 3.62 MIL/uL — ABNORMAL LOW (ref 3.87–5.11)
RDW: 12.5 % (ref 11.5–15.5)
WBC: 15.2 10*3/uL — ABNORMAL HIGH (ref 4.0–10.5)
nRBC: 0 % (ref 0.0–0.2)

## 2022-11-30 LAB — URINE CULTURE: Culture: NO GROWTH

## 2022-11-30 LAB — RPR: RPR Ser Ql: NONREACTIVE

## 2022-11-30 LAB — GC/CHLAMYDIA PROBE AMP (~~LOC~~) NOT AT ARMC
Chlamydia: NEGATIVE
Comment: NEGATIVE
Comment: NORMAL
Neisseria Gonorrhea: NEGATIVE

## 2022-11-30 LAB — TSH: TSH: 0.483 u[IU]/mL (ref 0.350–4.500)

## 2022-11-30 MED ORDER — LACTATED RINGERS IV SOLN: INTRAVENOUS | Status: DC

## 2022-11-30 NOTE — Progress Notes (Signed)
Dr. Alesia Richards made aware of patient's HR of 110-125. No new orders given. Toya Smothers, RN

## 2022-11-30 NOTE — Progress Notes (Addendum)
Alice [name] is a 27 y.o. G2P1001 at 50w4dadmitted for Preterm premature rupture of membranes,  Subjective: Patient reports doing well. She denies fevers or chills. Her headache has improved. She denies nausea/vomiting/chest pain or shortness of breath. She feels contractions of mild intensity. She continues with leakage of fluid. She reports normal fetal movements of both twins.   Objective: BP 117/69 (BP Location: Right Arm)   Pulse (!) 115   Temp 97.7 F (36.5 C) (Oral)   Resp 17   Ht '5\' 2"'$  (1.575 m)   Wt 69.5 kg   LMP 05/02/2022 (Approximate)   SpO2 100%   BMI 28.02 kg/m  I/O last 3 completed shifts: In: 6153.5 [P.O.:2014; I.V.:1886.7; IV Piggyback:2252.8] Out: [id number][Urine:3551] Total I/O In: 100 [IV Piggyback:100] Out: 700 [Urine:700]  FHT Twin A:  FHR: 130 bpm, variability: moderate,  accelerations:  Present,  decelerations:  Absent FHT Twin B:  FHR: 120 bpm, variability: moderate,  accelerations:  Present,  decelerations:  Absent UC:   irregular, every 5 to 8 minutes SVE:   Dilation: 1.5 Effacement (%): 60 Station: -3 Exam by:: [employer] [facility] CNM CVS: S1, S2, tachycardic. Pulmonary: Clear to auscultation bilaterally. Abdomen: Soft, Gravid, non tender Extremities: Warm and well perfused, no edema, no calf tenderness. 1+ patellar reflex bilaterally.  Peripad: With small blood tinged fluid on pad.   Labs: Lab Results  Component Value Date   WBC 15.2 (H) 11/30/2022   HGB 9.9 (L) 11/30/2022   HCT 31.6 (L) 11/30/2022   MCV 87.3 11/30/2022   PLT 267 11/30/2022    Current Facility-Administered Medications:    ampicillin (OMNIPEN) 2 g in sodium chloride 0.9 % 100 mL IVPB, 2 g, Intravenous, Q6H, Last Rate: 300 mL/hr at 11/30/22 1210, 2 g at 11/30/22 1210 **FOLLOWED BY** [START ON 12/01/2022] amoxicillin (AMOXIL) capsule 500 mg, 500 mg, Oral, TID, M[place] Jade, FNP   calcium carbonate (TUMS - dosed in mg elemental calcium) chewable tablet 400 mg of elemental calcium, 2  tablet, Oral, Q4H PRN, M[place] Jade, FNP   docusate sodium (COLACE) capsule 100 mg, 100 mg, Oral, Daily, M[place] JShirley FNP   lactated ringers infusion, , Intravenous, Continuous, Racheal Mathurin, MD   magnesium sulfate 40 grams in SWI 1000 mL OB infusion, 2 g/hr, Intravenous, Titrated, [place], JTickfaw FNorth Calaver[place] Last Rate: 50 mL/hr at 11/30/22 0347, 2 g/hr at 11/30/22 0347   prenatal multivitamin tablet 1 tablet, 1 tablet, Oral, Q1200, MEast [place] JLindale FNP, 1 tablet at 11/30/22 1208   zolpidem (AMBIEN) tablet 5 mg, 5 mg, Oral, QHS PRN, MNoralyn Pick FNP    Assessment / Plan: G2P1001 at 391w4dith Mono/Di twins admitted for Preterm premature rupture of membranes, with stable maternal and fetuses statuses,  PPROM: On latency antibiotics.  S/p betamethasone x 2 doses. Getting magnesium sulfate for cerebral palsy prophylaxis and tocolysis, plan to sto magnesium sulfate tomorrow at 11 am. Plan is for inpatient admission until delivery. If no spontaneous delivery then will induce at [redacted] weeks EGA.  Patient has been scheduled for Induction of labor on March 30th in the AM.  Fetal Wellbeing:  Category I I/D:   GBS and GC/Chhamydia results pending.  Anticipated MOD:  NSVD of Cephalic/Breech presenting twins.  Maternal tachycardia: Appears to be baseline. With normal TSH and free T4 is pending. Patient is asymptomatic. Continue with close monitoring.   EmArchie EndoMD 11/30/2022, 11:16 AM

## 2022-12-01 LAB — GLUCOSE, CAPILLARY: Glucose-Capillary: 91 mg/dL (ref 70–99)

## 2022-12-01 NOTE — Progress Notes (Signed)
Patient ID: Alice [name], female   DOB: 05-14-1996, 27 y.o.   MRN: [id number] Alice [name] is a 27 y.o. G2P1001 at 32w5dadmitted for rupture of membranes, Preterm labor, [facility]  Subjective: Feels ctxs softly maybe every 158m.  Still leaking very dilute pink per pt.  Good FM.  Objective: BP (!) 116/52 (BP Location: Left Arm)   Pulse (!) 111   Temp 98.2 F (36.8 C) (Oral)   Resp 18   Ht '5\' 2"'$  (1.575 m)   Wt 69.5 kg   LMP 05/02/2022 (Approximate)   SpO2 98%   BMI 28.02 kg/m  I/O last 3 completed shifts: In: 9558.2 [P.O.:3140; I.V.:3665.4; IV Piggyback:2752.8] Out: 7525 [Urine:7525] Total I/O In: -  Out: 500 [Urine:500]  Physical Exam:  Gen: alert no distress Chest/Lungs: unlabored  Heart/Pulse: RRR  Abdomen: soft, gravid, nontender, BX x4 quad Uterine fundus: soft, nontender Skin & Color: warm and dry  Neurological: AOx3 EXT: negative Homan's b/l, edema negative  FHT:  FHR: 130s bpm, variability: moderate,  accelerations:  Present,  decelerations:  Absent x 2 UC:   irregular SVE:   Dilation: 1.5 Effacement (%): 60 Station: -3 Exam by:: JaConAgra FoodsCNM  Labs: Lab Results  Component Value Date   WBC 15.2 (H) 11/30/2022   HGB 9.9 (L) 11/30/2022   HCT 31.6 (L) 11/30/2022   MCV 87.3 11/30/2022   PLT 267 11/30/2022    Assessment and Plan: has Normal labor; Normal spontaneous vaginal delivery; and Preterm labor on their problem list.  Pt never took GCT, will check CBGs S/p BMZ and magnesium Questions answered On latency antibiotics GBS neg Desires vaginal delivery but understands she may need a c/s  Fetal status overall reassuring cat 1  AnDelice Lesch/14/2024, 11:26 AM

## 2022-12-02 ENCOUNTER — Encounter (HOSPITAL_COMMUNITY): Payer: Self-pay

## 2022-12-02 ENCOUNTER — Inpatient Hospital Stay
Admission: RE | Admit: 2022-12-02 | Discharge: 2022-12-08 | DRG: 786 | Disposition: A | Discharge status: Home / Self Care | Payer: Medicaid Other

## 2022-12-02 ENCOUNTER — Encounter: Payer: Self-pay | Admitting: Obstetrics and Gynecology

## 2022-12-02 DIAGNOSIS — O9902 Anemia complicating childbirth: Secondary | ICD-10-CM | POA: Diagnosis present

## 2022-12-02 DIAGNOSIS — D62 Acute posthemorrhagic anemia: Secondary | ICD-10-CM | POA: Diagnosis not present

## 2022-12-02 DIAGNOSIS — O4590 Premature separation of placenta, unspecified, unspecified trimester: Secondary | ICD-10-CM | POA: Diagnosis not present

## 2022-12-02 DIAGNOSIS — O42913 Preterm premature rupture of membranes, unspecified as to length of time between rupture and onset of labor, third trimester: Secondary | ICD-10-CM | POA: Diagnosis present

## 2022-12-02 DIAGNOSIS — O321XX2 Maternal care for breech presentation, fetus 2: Secondary | ICD-10-CM | POA: Diagnosis present

## 2022-12-02 DIAGNOSIS — D509 Iron deficiency anemia, unspecified: Secondary | ICD-10-CM | POA: Diagnosis present

## 2022-12-02 DIAGNOSIS — O4593 Premature separation of placenta, unspecified, third trimester: Secondary | ICD-10-CM | POA: Diagnosis present

## 2022-12-02 DIAGNOSIS — O30033 Twin pregnancy, monochorionic/diamniotic, third trimester: Secondary | ICD-10-CM | POA: Diagnosis present

## 2022-12-02 DIAGNOSIS — Z3A31 31 weeks gestation of pregnancy: Secondary | ICD-10-CM | POA: Diagnosis not present

## 2022-12-02 DIAGNOSIS — O43123 Velamentous insertion of umbilical cord, third trimester: Secondary | ICD-10-CM | POA: Diagnosis present

## 2022-12-02 DIAGNOSIS — Z3A32 32 weeks gestation of pregnancy: Secondary | ICD-10-CM | POA: Diagnosis not present

## 2022-12-02 DIAGNOSIS — O42113 Preterm premature rupture of membranes, onset of labor more than 24 hours following rupture, third trimester: Principal | ICD-10-CM | POA: Diagnosis present

## 2022-12-02 LAB — CBC
HCT: 28.8 % — ABNORMAL LOW (ref 36.0–46.0)
HCT: 32.5 % — ABNORMAL LOW (ref 36.0–46.0)
Hemoglobin: 9.2 g/dL — ABNORMAL LOW (ref 12.0–15.0)
Hemoglobin: 10.2 g/dL — ABNORMAL LOW (ref 12.0–15.0)
MCH: 28 pg (ref 26.0–34.0)
MCH: 27.9 pg (ref 26.0–34.0)
MCHC: 31.9 g/dL (ref 30.0–36.0)
MCHC: 31.4 g/dL (ref 30.0–36.0)
MCV: 87.5 fL (ref 80.0–100.0)
MCV: 88.8 fL (ref 80.0–100.0)
Platelets: 252 10*3/uL (ref 150–400)
Platelets: 291 10*3/uL (ref 150–400)
RBC: 3.29 MIL/uL — ABNORMAL LOW (ref 3.87–5.11)
RBC: 3.66 MIL/uL — ABNORMAL LOW (ref 3.87–5.11)
RDW: 12.4 % (ref 11.5–15.5)
RDW: 12.4 % (ref 11.5–15.5)
WBC: 12.3 10*3/uL — ABNORMAL HIGH (ref 4.0–10.5)
WBC: 13 10*3/uL — ABNORMAL HIGH (ref 4.0–10.5)
nRBC: 0 % (ref 0.0–0.2)
nRBC: 0 % (ref 0.0–0.2)

## 2022-12-02 LAB — OB RESULTS CONSOLE HIV ANTIBODY (ROUTINE TESTING): HIV: NONREACTIVE

## 2022-12-02 LAB — GLUCOSE, CAPILLARY
Glucose-Capillary: 83 mg/dL (ref 70–99)
Glucose-Capillary: 104 mg/dL — ABNORMAL HIGH (ref 70–99)
Glucose-Capillary: 88 mg/dL (ref 70–99)
Glucose-Capillary: 87 mg/dL (ref 70–99)

## 2022-12-02 LAB — OB RESULTS CONSOLE RUBELLA ANTIBODY, IGM: Rubella: IMMUNE

## 2022-12-02 LAB — T4: T4, Total: 9.6 ug/dL (ref 4.5–12.0)

## 2022-12-02 LAB — OB RESULTS CONSOLE HEPATITIS B SURFACE ANTIGEN: Hepatitis B Surface Ag: NEGATIVE

## 2022-12-02 MED ORDER — SODIUM CHLORIDE 0.9 % IV SOLN: 250.0000 mL | INTRAVENOUS | Status: DC | PRN

## 2022-12-02 MED ORDER — AMOXICILLIN 250 MG PO CAPS
250.0000 mg | ORAL_CAPSULE | Freq: Three times a day (TID) | ORAL | Status: DC
  Administered 2022-12-02 – 2022-12-05 (×8): 250 mg via ORAL
  Filled 2022-12-02 (×9): qty 1

## 2022-12-02 MED ORDER — PRENATAL MULTIVITAMIN CH
1.0000 | ORAL_TABLET | Freq: Every day | ORAL | Status: DC
  Administered 2022-12-04: 1 via ORAL
  Filled 2022-12-02: qty 1

## 2022-12-02 MED ORDER — SODIUM CHLORIDE 0.9% FLUSH
3.0000 mL | Freq: Two times a day (BID) | INTRAVENOUS | Status: DC
  Administered 2022-12-03 – 2022-12-04 (×3): 3 mL via INTRAVENOUS

## 2022-12-02 MED ORDER — VITAMIN D 25 MCG (1000 UNIT) PO TABS
1000.0000 [IU] | ORAL_TABLET | Freq: Every day | ORAL | Status: DC
  Administered 2022-12-03: 1000 [IU] via ORAL
  Filled 2022-12-02 (×6): qty 1

## 2022-12-02 MED ORDER — ACETAMINOPHEN 500 MG PO TABS: 1000.0000 mg | ORAL_TABLET | Freq: Four times a day (QID) | ORAL | Status: DC | PRN

## 2022-12-02 MED ORDER — FERROUS SULFATE 325 (65 FE) MG PO TABS
325.0000 mg | ORAL_TABLET | Freq: Two times a day (BID) | ORAL | Status: DC
  Administered 2022-12-03 – 2022-12-08 (×10): 325 mg via ORAL
  Filled 2022-12-02 (×10): qty 1

## 2022-12-02 MED ORDER — SODIUM CHLORIDE 0.9% FLUSH
3.0000 mL | INTRAVENOUS | Status: DC | PRN
  Administered 2022-12-02: 3 mL via INTRAVENOUS

## 2022-12-02 MED ORDER — LACTATED RINGERS IV BOLUS
500.0000 mL | Freq: Once | INTRAVENOUS | Status: AC
  Administered 2022-12-02: 500 mL via INTRAVENOUS

## 2022-12-02 MED ORDER — CALCIUM CARBONATE ANTACID 500 MG PO CHEW: 2.0000 | CHEWABLE_TABLET | ORAL | Status: DC | PRN

## 2022-12-02 MED ORDER — AZITHROMYCIN 250 MG PO TABS
250.0000 mg | ORAL_TABLET | Freq: Every day | ORAL | Status: DC
  Administered 2022-12-03 – 2022-12-05 (×3): 250 mg via ORAL
  Filled 2022-12-02 (×3): qty 1

## 2022-12-02 NOTE — Discharge Summary (Signed)
Plan of Care Note for accepted transfer   Patient: Alice [name] MRN: [id number]   DOA: 11/29/2022  HPI 27 y.o.G2P1001 [redacted]w[redacted]d with mono/do twins. SROM at 31+3 wks, clear fluid. Stable for transfer.  Facility requesting transfer: [facility] Requesting Provider: Dr. Drema Dallas, DO  Reason for transfer: [redacted]w[redacted]d pregnancy with Monochorionic/Diamniotic twin gestation, PPROM, preterm contractions, need for NICU services if delivery is imminent ([facility]ICU census is full at [facility])   Facility course:  Continuous fetal monitoring of twin A and twin B Ultrasound - twin A is cephalic and twin B is breech Latency antibiotics - azithromycin, ampicillin, f/by amox PO Magnesium sulfate for neuro protection  Betamethasone x2 doses NICU Consult MFM Consult   Plan of care: The patient is accepted for admission to labor and delivery/antepartum unit, at [facility].  Author: Arrie Eastern, CNM 12/02/2022  Check [email or web address] for on-call coverage.  Nursing staff, Please call [place] number on Amion as soon as patient's arrival, so appropriate admitting provider can evaluate the pt.

## 2022-12-02 NOTE — H&P (Signed)
OB History & Physical   History of Present Illness:   Chief Complaint: transfer from [facility] in [place]  HPI:  Alice [name] is a 27 y.o. G2P1001 female at [redacted]w[redacted]d, Patient's last menstrual period was 05/02/2022 (approximate)., not consistent with [place] at [redacted]w[redacted]d, with Estimated Date of Delivery: 01/28/23.  She was transferred to L&D from [facility] in [place] for inpatient antepartum management d/t pPROM and mono/di twin gestation.  She ruptured for clear fluid on 11/29/2022 at 0800.  Has irregular contractions but nothing consistent.  Denies vaginal bleeding and endorses good fetal movement.   Reports active fetal movement  Contractions: irregular cramping  LOF/SROM: clear fluid 11/29/2022 at 0800 Vaginal bleeding: denies   Factors complicating pregnancy:  pPROM at [redacted]w[redacted]d Mono/di twin gestation  Maternal iron deficiency anemia  Vitamin D deficiency   Patient Active Problem List   Diagnosis Date Noted   Preterm premature rupture of membranes (PPROM) with onset of labor after 24 hours of rupture in third trimester, antepartum 12/02/2022   Preterm labor 11/29/2022   Normal spontaneous vaginal delivery 02/18/2018   Normal labor 02/17/2018    Prenatal Transfer Tool  Maternal Diabetes: No Genetic Screening: Normal Maternal Ultrasounds/Referrals: Normal Fetal Ultrasounds or other Referrals:  Fetal echo, Referred to Materal Fetal Medicine  Maternal Substance Abuse:  No Significant Maternal Medications:  None Significant Maternal Lab Results: Group B Strep negative  Maternal Medical History:   Past Medical History:  Diagnosis Date   Anemia     Past Surgical History:  Procedure Laterality Date   NO PAST SURGERIES      No Known Allergies  Prior to Admission medications   Not on File     Prenatal care site:  [facility]   OB History  Gravida Para Term Preterm AB Living  2 1 1  0 0 1  SAB IAB Ectopic Multiple Live Births  0 0 0 0 1     # Outcome Date GA Lbr Len/2nd Weight Sex Delivery Anes PTL Lv  2 Current           1 Term 02/17/18 [redacted]w[redacted]d 07:40 / 01:34 3464 g F Vag-Spont EPI  LIV     Name: Alice [name], Alice [name]     Apgar1: 8  Apgar5: 9     Social History: She  reports that she has never smoked. She has never used smokeless tobacco. She reports that she does not drink alcohol and does not use drugs.  Family History: family history is not on file.   Review of Systems: A full review of systems was performed and negative except as noted in the HPI.     Physical Exam:  Vital Signs: BP 130/70 (BP Location: Right Arm)   Pulse 80   Temp 98.4 F (36.9 C) (Oral)   Resp 19   LMP 05/02/2022 (Approximate)   General: no acute distress.  HEENT: normocephalic, atraumatic Heart: regular rate & rhythm Lungs: normal respiratory effort Abdomen: soft, gravid, non-tender Pelvic:   External: Normal external female genitalia  Cervix: 1/50/-3 prior to transfer    Extremities: non-tender, symmetric, no edema bilaterally.  DTRs: 2+/2+  Neurologic: Alert & oriented x 3.    Results for orders placed or performed during the hospital encounter of 11/29/22 (from the past 24 hour(s))  Glucose, capillary     Status: None   Collection Time: 12/02/22  4:52 AM  Result Value Ref Range   Glucose-Capillary 83 70 - 99 mg/dL  Glucose, capillary  Status: Abnormal   Collection Time: 12/02/22 10:29 AM  Result Value Ref Range   Glucose-Capillary 104 (H) 70 - 99 mg/dL  Glucose, capillary     Status: None   Collection Time: 12/02/22 12:42 PM  Result Value Ref Range   Glucose-Capillary 88 70 - 99 mg/dL  CBC     Status: Abnormal   Collection Time: 12/02/22  3:50 PM  Result Value Ref Range   WBC 12.3 (H) 4.0 - 10.5 K/uL   RBC 3.29 (L) 3.87 - 5.11 MIL/uL   Hemoglobin 9.2 (L) 12.0 - 15.0 g/dL   HCT 28.8 (L) 36.0 - 46.0 %   MCV 87.5 80.0 - 100.0 fL   MCH 28.0 26.0 - 34.0 pg   MCHC 31.9 30.0 - 36.0 g/dL   RDW 12.4 11.5 - 15.5 %   Platelets  252 150 - 400 K/uL   nRBC 0.0 0.0 - 0.2 %  Glucose, capillary     Status: None   Collection Time: 12/02/22  5:29 PM  Result Value Ref Range   Glucose-Capillary 87 70 - 99 mg/dL    Pertinent Results:  Prenatal Labs: Blood type/Rh O POS   Antibody screen Negative   Rubella Immune (11/13 0000)   Varicella Unknown  RPR NON REACTIVE (03/12 1308)   HBsAg Negative (11/13 0000)  Hep C NR   HIV Non-reactive (11/13 0000)   GC neg  Chlamydia neg  Genetic screening cfDNA negative   1 hour GTT Not done - A1C at 28 weeks 4.8  3 hour GTT N/A  GBS NEGATIVE/-- (03/12 0952)     Baby A NST: Baseline FHR: 140 beats/min Variability: moderate Accelerations: present Decelerations: absent Presentation: vertex by ultrasound   Baby B NST: Baseline FHR: 130 beats/min Variability: moderate Accelerations: present Decelerations: absent Presentation: breech by ultrasound  Tocometry: Irregular, mild contractions   Interpretation: category I tracing x 2  INDICATIONS: multiple gestation RESULTS:  A NST procedure was performed with FHR monitoring and a normal baseline established, appropriate time of 20-40 minutes of evaluation, and accels >2 seen w 15x15 characteristics.  Results show a REACTIVE NST.    [place] MFM OB Follow Up  Result Date: 11/29/2022 ----------------------------------------------------------------------  OBSTETRICS REPORT                       (Signed Final 11/29/2022 02:20 pm) ---------------------------------------------------------------------- Patient Info  ID #:       [id number]                          D.O.B.:  Mar 20, 1996 (26 yrs)  Name:       Alice [name]                 Visit Date: 11/29/2022 12:32 pm ---------------------------------------------------------------------- Performed By  Attending:        Johnell Comings MD         Secondary Phy.:   [facility]  Performed By:     Berlinda Last          Address:  [name]                                                             [facility]                                                             [place]                                                             [place], [place]  Referred By:      [facility] MAU/Triage         Location:         [facility]                                                             [place] ---------------------------------------------------------------------- Orders  #  Description                           Code        Ordered By  1  [place] MFM OB FOLLOW UP                   76816.01    YU FANG  2  [place] MFM OB FOLLOW UP ADDL              [id number]    YU FANG     GEST ----------------------------------------------------------------------  #  Order #  Accession #                Episode #  1  [id number]                   [id number]                 [id number]  2  [id number]                   [id number]                 [id number] ---------------------------------------------------------------------- Indications  Premature rupture of membranes - leaking       O42.90  fluid  Twin pregnancy, mono/di, third trimester       O30.033  Vaginal bleeding in pregnancy, third trimester O46.93  [redacted] weeks gestation of pregnancy                Z3A.31 ---------------------------------------------------------------------- Fetal Evaluation (Fetus A)  Num Of Fetuses:         2  Fetal Heart Rate(bpm):  143  Cardiac Activity:       Observed  Fetal Lie:              Maternal left side  Presentation:           Cephalic  Placenta:               Fundal  P. Cord Insertion:      Previously visualized  Membrane Desc:      Dividing Membrane seen - Monochorionic  Amniotic Fluid  AFI FV:      Within normal limits                               Largest Pocket(cm)                              2.1 ---------------------------------------------------------------------- Biometry (Fetus A)  BPD:      76.8  mm     G. Age:  30w 6d         22  %    CI:        80.42   %    70 - 86                                                          FL/HC:      22.8   %    19.3 - 21.3  HC:      270.5  mm     G. Age:  29w 4d        < 1  %    HC/AC:      0.96        0.96 - 1.17  AC:       281   mm     G. Age:  32w 1d         69  %    FL/BPD:     80.2   %    71 - 87  FL:       61.6  mm     G. Age:  32w 0d         51  %  FL/AC:      21.9   %    20 - 24  Est. FW:    1818  gm           4 lb     47  %     FW Discordancy     0 \ 11 % ---------------------------------------------------------------------- OB History  Blood Type:   O+  Gravidity:    2         Term:   1  Living:       1 ---------------------------------------------------------------------- Gestational Age (Fetus A)  LMP:           30w 1d        Date:  05/02/22                  EDD:   02/06/23  Clinical EDD:  32w 4d                                        EDD:   01/20/23  U/S Today:     31w 1d                                        EDD:   01/30/23  Best:          31w 3d     Det. By:  U/S Fetus A              EDD:   01/28/23                                      (09/01/22) ---------------------------------------------------------------------- Anatomy (Fetus A)  Cranium:               Appears normal         LVOT:                   Previously seen  Cavum:                 Previously seen        Aortic Arch:            Previously seen  Ventricles:            Appears normal         Ductal Arch:            Previously seen  Choroid Plexus:        Previously seen        Diaphragm:              Appears normal  Cerebellum:            Appears normal         Stomach:                Appears normal, left  sided  Posterior Fossa:       Previously seen        Abdomen:                 Appears normal  Nuchal Fold:           Previously seen        Abdominal Wall:         Previously seen  Face:                  Orbits and profile     Cord Vessels:           Previously seen                         previously seen  Lips:                  Not well visualized    Kidneys:                Appear normal  Palate:                Not well visualized    Bladder:                Appears normal  Thoracic:              Appears normal         Spine:                  Appears normal  Heart:                 Previously seen        Upper Extremities:      Previously seen  RVOT:                  Previously seen        Lower Extremities:      Previously seen  Other:  Female gender previously seen. Nasal bone, lenses, maxilla,          mandible, falx and open hands, VC, 3VV and 3VTV previously          visualized. 5th digits, heels/feet visualized. ---------------------------------------------------------------------- Fetal Evaluation (Fetus B)  Num Of Fetuses:         2  Fetal Heart Rate(bpm):  148  Cardiac Activity:       Observed  Fetal Lie:              Maternal right side  Presentation:           Breech  Placenta:               Fundal  P. Cord Insertion:      Marginal insertion prev seen  Membrane Desc:      Dividing Membrane seen - Monochorionic  Amniotic Fluid  AFI FV:      Within normal limits                              Largest Pocket(cm)                              3.6 ---------------------------------------------------------------------- Biometry (Fetus B)  BPD:      71.7  mm     G. Age:  28w 5d        < 1  %  CI:        69.95   %    70 - 86                                                          FL/HC:      21.5   %    19.3 - 21.3  HC:      273.5  mm     G. Age:  29w 6d        1.1  %    HC/AC:      1.01        0.96 - 1.17  AC:       270   mm     G. Age:  31w 1d         37  %    FL/BPD:     82.0   %    71 - 87  FL:       58.8  mm     G. Age:  30w 5d         18  %    FL/AC:      21.8   %    20 -  24  Est. FW:    1614  gm      3 lb 9 oz     17  %     FW Discordancy        11  % ---------------------------------------------------------------------- Gestational Age (Fetus B)  LMP:           30w 1d        Date:  05/02/22                  EDD:   02/06/23  Clinical EDD:  32w 4d                                        EDD:   01/20/23  U/S Today:     30w 1d                                        EDD:   02/06/23  Best:          31w 3d     Det. By:  U/S Fetus A              EDD:   01/28/23                                      (09/01/22) ---------------------------------------------------------------------- Anatomy (Fetus B)  Cranium:               Appears normal         LVOT:                   Previously seen  Cavum:                 Appears normal         Aortic Arch:  Previously seen  Ventricles:            Previously seen        Ductal Arch:            Previously seen  Choroid Plexus:        Previously seen        Diaphragm:              Appears normal  Cerebellum:            Previously seen        Stomach:                Appears normal, left                                                                        sided  Posterior Fossa:       Previously seen        Abdomen:                Appears normal  Nuchal Fold:           Previously seen        Abdominal Wall:         Previously seen  Face:                  Orbits and profile     Cord Vessels:           Previously seen                         previously seen  Lips:                  Previously seen        Kidneys:                Appear normal  Palate:                Previously             Bladder:                Appears normal                         visualized  Thoracic:              Appears normal         Spine:                  Previously seen  Heart:                 Previously seen        Upper Extremities:      Previously seen  RVOT:                  Previously seen        Lower Extremities:      Previously seen  Other:  Female gender previously seen.  Nasal bone, lenses, maxilla,          mandible and falx, 3VV and 3VTV previously visualized. 5th digits,  heels/feet, VC previously visualized. ---------------------------------------------------------------------- Cervix Uterus Adnexa  Cervix  Not visualized (advanced GA >24wks) ---------------------------------------------------------------------- Comments  Alice [name] is a 27 year old gravida 2 para 1-0-0-1  currently at 31 weeks and 3 days with a monochorionic,  diamniotic twin gestation.  She was seen in consultation at  the request of Dr. Mancel Bale due to [facility].  The patient  reports that she ruptured membranes spontaneously this  morning after getting up from bed.  Rupture of membranes  was confirmed in the MAU.  Currently, the fetal status of both twin A and twin B are  reassuring.  Contractions q. 3 to 5 minutes are noted on the  toco.  The patient reports that she is feeling contractions.  Due to PPROM at her current gestational age, she was  started on magnesium sulfate for tocolysis and fetal  neuroprotection.  She was also given betamethasone and  latency antibiotics.  An ultrasound performed this morning showed an EFW for  twin A of 4 pounds (47th percentile) and an EFW for twin B of  3 pounds 9 ounces (17th percentile).  The maximal vertical pocket for twin A was 2.1 cm and for  twin B was 3.6 cm.  Twin A was in the vertex presentation.  Twin B is breech.  Due to the monochorionic twin gestation, she had a normal  fetal echocardiogram with [facility] pediatric cardiology.  The usual management and implications of PPROM were  discussed with the patient.  She was advised that due to rupture of membranes, she will  require inpatient management until delivery with daily fetal  testing.  She should receive a complete a course of latency antibiotics  and antenatal corticosteroids.  The patient was advised that she will be treated with  magnesium sulfate until she completes her 48-hour steroid  course.  Due  to PPROM, delivery is recommended at around 34  weeks.  Delivery prior to 34 weeks will be indicated:  should she go into spontaneous labor  should she experience heavy vaginal bleeding  should she show any signs of an intrauterine infection  at any time for nonreassuring fetal status  The patient was reassured that most babies delivered at  around 34 weeks following PPROM generally do well.  However, her baby will require a NICU admission following  delivery.  We will continue to follow her closely with you throughout her  hospital stay.  At the end of the consultation, the patient stated that all her  questions had been answered to her complete satisfaction.  Recommendations:  -Inpatient management until delivery  -Complete course of antenatal corticosteroids  -Daily fetal testing  -Latency antibiotics  -Delivery at around 34 weeks  -Delivery prior to 34 weeks would be indicated:  should she go into spontaneous labor  should she experience heavy vaginal bleeding  should she show any signs of an intrauterine infection  at any time for nonreassuring fetal status ----------------------------------------------------------------------                   Johnell Comings, MD Electronically Signed Final Report   11/29/2022 02:20 pm ----------------------------------------------------------------------  [place] MFM OB FOLLOW UP ADDL GEST  Result Date: 11/29/2022 ----------------------------------------------------------------------  OBSTETRICS REPORT                       (Signed Final 11/29/2022 02:20 pm) ---------------------------------------------------------------------- Patient Info  ID #:       [id number]  D.O.B.:  02-16-1996 (26 yrs)  Name:       Alice [name]                 Visit Date: 11/29/2022 12:32 pm ---------------------------------------------------------------------- Performed By  Attending:        Johnell Comings MD         Secondary Phy.:   JADE MONTANA                                                              CNM  Performed By:     Berlinda Last          Address:          [place]                                                             [address]                                                             [place], [place]  Referred By:      [facility] MAU/Triage         [facility] ---------------------------------------------------------------------- Orders  #  Description                           Code        Ordered By  1  [place] MFM OB FOLLOW UP                   76816.01    YU FANG  2  [place] MFM OB FOLLOW UP ADDL              [id number]    YU FANG     GEST ----------------------------------------------------------------------  #  Order #                     Accession #                Episode #  1  [id number]                   [id number]                 [id number]  2  [id number]                   [id number]                 [id number] ---------------------------------------------------------------------- Indications  Premature rupture of membranes - leaking       O42.90  fluid  Twin pregnancy, mono/di, third trimester       O30.033  Vaginal bleeding in pregnancy, third trimester O46.93  [redacted] weeks gestation of pregnancy                Z3A.31 ---------------------------------------------------------------------- Fetal Evaluation (Fetus A)  Num Of Fetuses:         2  Fetal Heart Rate(bpm):  143  Cardiac Activity:       Observed  Fetal Lie:              Maternal left side  Presentation:           Cephalic  Placenta:               Fundal  P. Cord Insertion:      Previously visualized  Membrane Desc:      Dividing Membrane seen -  Monochorionic  Amniotic Fluid  AFI FV:      Within normal limits                              Largest Pocket(cm)                              2.1 ---------------------------------------------------------------------- Biometry (Fetus A)  BPD:      76.8  mm     G. Age:  30w 6d         22  %    CI:        80.42   %    70 - 86                                                          FL/HC:      22.8   %  19.3 - 21.3  HC:      270.5  mm     G. Age:  29w 4d        < 1  %    HC/AC:      0.96        0.96 - 1.17  AC:       281   mm     G. Age:  32w 1d         69  %    FL/BPD:     80.2   %    71 - 87  FL:       61.6  mm     G. Age:  32w 0d         51  %    FL/AC:      21.9   %    20 - 24  Est. FW:    1818  gm           4 lb     47  %     FW Discordancy     0 \ 11 % ---------------------------------------------------------------------- OB History  Blood Type:   O+  Gravidity:    2         Term:   1  Living:       1 ---------------------------------------------------------------------- Gestational Age (Fetus A)  LMP:           30w 1d        Date:  05/02/22                  EDD:   02/06/23  Clinical EDD:  32w 4d                                        EDD:   01/20/23  U/S Today:     31w 1d                                        EDD:   01/30/23  Best:          31w 3d     Det. By:  U/S Fetus A              EDD:   01/28/23                                      (09/01/22) ---------------------------------------------------------------------- Anatomy (Fetus A)  Cranium:               Appears normal         LVOT:                   Previously seen  Cavum:                 Previously seen        Aortic Arch:            Previously seen  Ventricles:            Appears normal         Ductal Arch:            Previously seen  Choroid Plexus:  Previously seen        Diaphragm:              Appears normal  Cerebellum:            Appears normal         Stomach:                Appears normal, left                                                                         sided  Posterior Fossa:       Previously seen        Abdomen:                Appears normal  Nuchal Fold:           Previously seen        Abdominal Wall:         Previously seen  Face:                  Orbits and profile     Cord Vessels:           Previously seen                         previously seen  Lips:                  Not well visualized    Kidneys:                Appear normal  Palate:                Not well visualized    Bladder:                Appears normal  Thoracic:              Appears normal         Spine:                  Appears normal  Heart:                 Previously seen        Upper Extremities:      Previously seen  RVOT:                  Previously seen        Lower Extremities:      Previously seen  Other:  Female gender previously seen. Nasal bone, lenses, maxilla,          mandible, falx and open hands, VC, 3VV and 3VTV previously          visualized. 5th digits, heels/feet visualized. ---------------------------------------------------------------------- Fetal Evaluation (Fetus B)  Num Of Fetuses:         2  Fetal Heart Rate(bpm):  148  Cardiac Activity:       Observed  Fetal Lie:              Maternal right side  Presentation:           Breech  Placenta:  Fundal  P. Cord Insertion:      Marginal insertion prev seen  Membrane Desc:      Dividing Membrane seen - Monochorionic  Amniotic Fluid  AFI FV:      Within normal limits                              Largest Pocket(cm)                              3.6 ---------------------------------------------------------------------- Biometry (Fetus B)  BPD:      71.7  mm     G. Age:  28w 5d        < 1  %    CI:        69.95   %    70 - 86                                                          FL/HC:      21.5   %    19.3 - 21.3  HC:      273.5  mm     G. Age:  29w 6d        1.1  %    HC/AC:      1.01        0.96 - 1.17  AC:       270   mm     G. Age:  31w 1d         37  %    FL/BPD:     82.0   %    71 - 87   FL:       58.8  mm     G. Age:  30w 5d         18  %    FL/AC:      21.8   %    20 - 24  Est. FW:    1614  gm      3 lb 9 oz     17  %     FW Discordancy        11  % ---------------------------------------------------------------------- Gestational Age (Fetus B)  LMP:           30w 1d        Date:  05/02/22                  EDD:   02/06/23  Clinical EDD:  32w 4d                                        EDD:   01/20/23  U/S Today:     30w 1d                                        EDD:   02/06/23  Best:          31w 3d     Det. By:  U/S Fetus A  EDD:   01/28/23                                      (09/01/22) ---------------------------------------------------------------------- Anatomy (Fetus B)  Cranium:               Appears normal         LVOT:                   Previously seen  Cavum:                 Appears normal         Aortic Arch:            Previously seen  Ventricles:            Previously seen        Ductal Arch:            Previously seen  Choroid Plexus:        Previously seen        Diaphragm:              Appears normal  Cerebellum:            Previously seen        Stomach:                Appears normal, left                                                                        sided  Posterior Fossa:       Previously seen        Abdomen:                Appears normal  Nuchal Fold:           Previously seen        Abdominal Wall:         Previously seen  Face:                  Orbits and profile     Cord Vessels:           Previously seen                         previously seen  Lips:                  Previously seen        Kidneys:                Appear normal  Palate:                Previously             Bladder:                Appears normal                         visualized  Thoracic:              Appears normal  Spine:                  Previously seen  Heart:                 Previously seen        Upper Extremities:      Previously seen  RVOT:                  Previously seen         Lower Extremities:      Previously seen  Other:  Female gender previously seen. Nasal bone, lenses, maxilla,          mandible and falx, 3VV and 3VTV previously visualized. 5th digits,          heels/feet, VC previously visualized. ---------------------------------------------------------------------- Cervix Uterus Adnexa  Cervix  Not visualized (advanced GA >24wks) ---------------------------------------------------------------------- Comments  Alice [name] is a 27 year old gravida 2 para 1-0-0-1  currently at 31 weeks and 3 days with a monochorionic,  diamniotic twin gestation.  She was seen in consultation at  the request of Dr. Mancel Bale due to [facility].  The patient  reports that she ruptured membranes spontaneously this  morning after getting up from bed.  Rupture of membranes  was confirmed in the MAU.  Currently, the fetal status of both twin A and twin B are  reassuring.  Contractions q. 3 to 5 minutes are noted on the  toco.  The patient reports that she is feeling contractions.  Due to PPROM at her current gestational age, she was  started on magnesium sulfate for tocolysis and fetal  neuroprotection.  She was also given betamethasone and  latency antibiotics.  An ultrasound performed this morning showed an EFW for  twin A of 4 pounds (47th percentile) and an EFW for twin B of  3 pounds 9 ounces (17th percentile).  The maximal vertical pocket for twin A was 2.1 cm and for  twin B was 3.6 cm.  Twin A was in the vertex presentation.  Twin B is breech.  Due to the monochorionic twin gestation, she had a normal  fetal echocardiogram with [facility] pediatric cardiology.  The usual management and implications of PPROM were  discussed with the patient.  She was advised that due to rupture of membranes, she will  require inpatient management until delivery with daily fetal  testing.  She should receive a complete a course of latency antibiotics  and antenatal corticosteroids.  The patient was advised that she will be  treated with  magnesium sulfate until she completes her 48-hour steroid  course.  Due to PPROM, delivery is recommended at around 34  weeks.  Delivery prior to 34 weeks will be indicated:  should she go into spontaneous labor  should she experience heavy vaginal bleeding  should she show any signs of an intrauterine infection  at any time for nonreassuring fetal status  The patient was reassured that most babies delivered at  around 34 weeks following PPROM generally do well.  However, her baby will require a NICU admission following  delivery.  We will continue to follow her closely with you throughout her  hospital stay.  At the end of the consultation, the patient stated that all her  questions had been answered to her complete satisfaction.  Recommendations:  -Inpatient management until delivery  -Complete course of antenatal corticosteroids  -Daily fetal testing  -Latency antibiotics  -Delivery at around 34 weeks  -Delivery prior to 34 weeks would  be indicated:  should she go into spontaneous labor  should she experience heavy vaginal bleeding  should she show any signs of an intrauterine infection  at any time for nonreassuring fetal status ----------------------------------------------------------------------                   Johnell Comings, MD Electronically Signed Final Report   11/29/2022 02:20 pm ----------------------------------------------------------------------   Assessment:  Alice [name] is a 27 y.o. G2P1001 female at [redacted]w[redacted]d with pPROM and mono/di twin gestation.   Plan:  1. Admit to Antepartum  - consents reviewed and obtained - Dr. Leafy Ro notified of admission and plan of care   2. Fetal Well being  - Fetal Tracing: category 1 tracing x 2  - Group B Streptococcus ppx not indicated: GBS negative - Presentation: Baby A cephalic, Baby B breech confirmed by ultrasound    3. High risk OB OB: - Prenatal labs reviewed, as above - Rh positive - CBC, T&S, RPR on admit - Regular diet,  saline lock - Bathroom privileges  - Activity as tolerated   4. pPROM - Routine antepartum care  - Completed 48 hours of IV antibiotics with Amoxicillin and Azithromycin.  Will continue 5 days of oral latency antibiotics.  - Betamethasone complete - received 2 doses  - 12 hours of magnesium sulfate completed  - Plan for delivery at 34 weeks  - Delivery prior to 34 weeks would be indicated: --should she go into spontaneous labor --should she experience heavy vaginal bleeding --should she show any signs of an intrauterine infection --at any time for nonreassuring fetal status - Anticipate vaginal delivery  5. Post Partum Planning: - Infant feeding: breast feeding - Contraception:  nexplanon  - Flu vaccine: Given prenatally - Tdap vaccine:  Uknown - RSV vaccine:  Not indicated   [name], CNM 12/02/22 11:01 PM  Drinda Butts, CNM Certified Nurse Midwife [facility]

## 2022-12-02 NOTE — Plan of Care (Signed)

## 2022-12-02 NOTE — OB Triage Note (Signed)
Patient arrived via [facility], ambulatory, placed on fetal monitoring, Was seen at [facility] for PROM, denies contractions currently.  Twin A 140s, Twin B 150s, will continue to monitor. CNM Drinda Butts aware of arrival @ 2050 will assess patient

## 2022-12-02 NOTE — Progress Notes (Addendum)
Patient ID: Alice [name], female   DOB: 16-Oct-1995, 27 y.o.   MRN: [id number] Alice [name] is a 27 y.o. G2P1001 at [redacted]w[redacted]d admitted for rupture of membranes, Preterm labor, [facility] Day No: 3  Subjective: Patient seen and evaluated at bedside and doing well with no complaint. Denies contractions and still leaking dilute pink fluid. No bright red blood and reports good fetal movement.    Objective: BP 129/74 (BP Location: Right Arm)   Pulse (!) 117   Temp 98.5 F (36.9 C) (Oral)   Resp 19   Ht 5\' 2"  (1.575 m)   Wt 69.5 kg   LMP 05/02/2022 (Approximate)   SpO2 100%   BMI 28.02 kg/m  I/O last 3 completed shifts: In: [id number] [P.O.:1580; I.V.:1677; IV Piggyback:400] Out: [id number] [Urine:3250] Total I/O In: -  Out: 400 [Urine:400]  Physical Exam:  Gen: alert no distress Chest/Lungs: unlabored  Heart/Pulse: RRR  Abdomen: soft, gravid, nontender Uterine fundus: soft, nontender Skin & Color: warm and dry  Neurological: AOx3 EXT: negative Homan's b/l, edema negative  FHT 0900:  FHR: 150s bpm, variability: moderate,  accelerations:  Present,  decelerations:  Present variable  x 2 UC:   none Last SVE 11/29/2022 @ 1240:   Dilation: 1.5 Effacement (%): 60 Station: -3 Exam by:: [employer], CNM  Labs: Lab Results  Component Value Date   WBC 15.2 (H) 11/30/2022   HGB 9.9 (L) 11/30/2022   HCT 31.6 (L) 11/30/2022   MCV 87.3 11/30/2022   PLT 267 11/30/2022    Assessment and Plan: has Normal labor; Normal spontaneous vaginal delivery; and Preterm labor on their problem list.  Pt never took GTT -CBGs wnl x24hr, will d/c S/p BMZ and magnesium Questions answered On latency antibiotics GBS and GC/CT neg Desires vaginal delivery but understands she may need a c/s  Fetal status overall reassuring Approved for wheel chair rides NST qshift  Daily CBC Repeat US Monday  Meekah Math D Katelen Luepke 12/02/2022, 2:14 PM

## 2022-12-02 NOTE — Progress Notes (Signed)
In to room to discuss plan of care with patient. Patient is having intermittent contractions but not feeling them strongly. I discussed with the patient that our NICU status remains critically full (confirmed this afternoon). It is recommended that she transfer to a hospital with NICU services available in the event she does go into preterm labor (which is unpredictable). Patient is amenable to transfer - we discussed transfer to [facility]. Contractions occurring every 1-4 minutes. FHT baseline 145bpm with moderate variability, + accel, - decel x 2.    Call made to [facility] OBGYN Physician on call (Dr. Benjaman Kindler) who states she and the facility are able to accept this patient transfer. [facility] transport arranged.   Drema Dallas, DO

## 2022-12-02 NOTE — Progress Notes (Signed)
SVE 1.5/50/-3 No vaginal bleeding Scant, clear, amniotic fluid Reactive NST for Twin A Reactive NST for Twin B Irregular contractions Stable for transfer

## 2022-12-03 ENCOUNTER — Other Ambulatory Visit: Payer: Self-pay

## 2022-12-03 LAB — BPAM RBC
Blood Product Expiration Date: 202404092359
Blood Product Expiration Date: 202404092359
Unit Type and Rh: 5100
Unit Type and Rh: 5100

## 2022-12-03 LAB — TYPE AND SCREEN
ABO/RH(D): O POS
Antibody Screen: NEGATIVE
Unit division: 0
Unit division: 0

## 2022-12-03 LAB — CBC WITH DIFFERENTIAL/PLATELET
Abs Immature Granulocytes: 0.35 10*3/uL — ABNORMAL HIGH (ref 0.00–0.07)
Basophils Absolute: 0.1 10*3/uL (ref 0.0–0.1)
Basophils Relative: 0 %
Eosinophils Absolute: 0.3 10*3/uL (ref 0.0–0.5)
Eosinophils Relative: 2 %
HCT: 31.6 % — ABNORMAL LOW (ref 36.0–46.0)
Hemoglobin: 9.8 g/dL — ABNORMAL LOW (ref 12.0–15.0)
Immature Granulocytes: 3 %
Lymphocytes Relative: 14 %
Lymphs Abs: 1.9 10*3/uL (ref 0.7–4.0)
MCH: 27.1 pg (ref 26.0–34.0)
MCHC: 31 g/dL (ref 30.0–36.0)
MCV: 87.5 fL (ref 80.0–100.0)
Monocytes Absolute: 1.3 10*3/uL — ABNORMAL HIGH (ref 0.1–1.0)
Monocytes Relative: 10 %
Neutro Abs: 9.9 10*3/uL — ABNORMAL HIGH (ref 1.7–7.7)
Neutrophils Relative %: 71 %
Platelets: 283 10*3/uL (ref 150–400)
RBC: 3.61 MIL/uL — ABNORMAL LOW (ref 3.87–5.11)
RDW: 12.3 % (ref 11.5–15.5)
WBC: 13.7 10*3/uL — ABNORMAL HIGH (ref 4.0–10.5)
nRBC: 0 % (ref 0.0–0.2)

## 2022-12-03 LAB — WET PREP, GENITAL
Clue Cells Wet Prep HPF POC: NONE SEEN
Sperm: NONE SEEN
Trich, Wet Prep: NONE SEEN
WBC, Wet Prep HPF POC: <10 (ref <10)
Yeast Wet Prep HPF POC: NONE SEEN

## 2022-12-03 MED ORDER — LACTATED RINGERS IV SOLN: INTRAVENOUS | Status: DC

## 2022-12-03 NOTE — Progress Notes (Signed)
Called to Room by Patient with Pt. Stating she has increased vaginal bleeding since Cervical check this morning. Pt. States this increase occurred when she was trying to have a Bowel movement.   Pt. States positive Fetal movement of both Fetuses. Sates intermittent CTX's that are not consistent. Her abdomen is soft to palpation at this time. Pt. Had been ambulating around room and I have reeducated her about BR with BRP and she v/o.  Pt.'s RN Stevie Kern and I weighed Pads that Pt. Had taken off and QBL was 100cc. The pad she is wearing, at this time, has a scant amount of serosang. drainage  J. Sharpes notified of all of the above and no new orders received.

## 2022-12-03 NOTE — Progress Notes (Signed)
ANTEPARTUM PROGRESS NOTE  Alice [name] is a 27 y.o. G2P1001 at [redacted]w[redacted]d who is admitted for pPROM with Mono/Di twins.  Transferred to [facility] from [facility] for Antenatal care. Estimated Date of Delivery: 01/28/23  - GBS negative - Presentation: Baby A cephalic, Baby B breech confirmed by ultrasound - Completed 48 hours of IV antibiotics with Amoxicillin and Azithromycin - Betamethasone complete - received 2 doses  - 12 hours of magnesium sulfate completed  - Delivery prior to 34 weeks would be indicated: --should she go into spontaneous labor --should she experience heavy vaginal bleeding --should she show any signs of an intrauterine infection --at any time for nonreassuring fetal status  Length of Stay:  1 Days. Admitted 12/02/2022  Subjective: Pt reports UCs this am. She rates them at 7/10 as burning sensation on the left side of her belly.  She has been trasferred to L&D for extending toco monitoring and for her daily NST.   Vitals:  BP 116/70   Pulse (!) 111   Temp 98.5 F (36.9 C) (Oral)   Resp 18   Ht 5\' 2"  (1.575 m)   Wt 69.4 kg   LMP 05/02/2022 (Approximate)   SpO2 98% Comment: Room Air  BMI 27.98 kg/m  Physical Examination: General:   alert and cooperative  Skin:  normal  Neurologic:    Alert & oriented x 3  Lungs:    Nl effort  Heart:   regular rate and rhythm  Abdomen:   Gravid, soft and nontender between contractions  Extremities: : non-tender, symmetric, no edema bilaterally.      Baby A NST: Baseline FHR: 145 beats/min Variability: moderate Accelerations: present Decelerations: absent Presentation: vertex by ultrasound    Baby B NST: Baseline FHR: 145 beats/min Variability: moderate Accelerations: present Decelerations: absent Presentation: breech by ultrasound  Tocometry: Irregular, mild contractions   Results for orders placed or performed during the hospital encounter of 12/02/22 (from the past 48 hour(s))  OB RESULTS CONSOLE HIV antibody      Status: None   Collection Time: 12/02/22 12:00 AM  Result Value Ref Range   HIV Non-reactive   OB RESULTS CONSOLE Rubella Antibody     Status: None   Collection Time: 12/02/22 12:00 AM  Result Value Ref Range   Rubella Immune   OB RESULTS CONSOLE Hepatitis B surface antigen     Status: None   Collection Time: 12/02/22 12:00 AM  Result Value Ref Range   Hepatitis B Surface Ag Negative   CBC     Status: Abnormal   Collection Time: 12/02/22 11:21 PM  Result Value Ref Range   WBC 13.0 (H) 4.0 - 10.5 K/uL   RBC 3.66 (L) 3.87 - 5.11 MIL/uL   Hemoglobin 10.2 (L) 12.0 - 15.0 g/dL   HCT 32.5 (L) 36.0 - 46.0 %   MCV 88.8 80.0 - 100.0 fL   MCH 27.9 26.0 - 34.0 pg   MCHC 31.4 30.0 - 36.0 g/dL   RDW 12.4 11.5 - 15.5 %   Platelets 291 150 - 400 K/uL   nRBC 0.0 0.0 - 0.2 %    Comment: Performed at [facility], [address] [place], [place] [address]    No results found.  Current scheduled medications  amoxicillin  250 mg Oral Q8H   azithromycin  250 mg Oral Daily   cholecalciferol  1,000 Units Oral Daily   ferrous sulfate  325 mg Oral BID WC   prenatal multivitamin  1 tablet Oral Q1200  sodium chloride flush  3 mL Intravenous Q12H    I have reviewed the patient's current medications.  ASSESSMENT: Patient Active Problem List   Diagnosis Date Noted   Preterm premature rupture of membranes (PPROM) with onset of labor after 24 hours of rupture in third trimester, antepartum 12/02/2022   Preterm labor 11/29/2022    PLAN: 1. Fetal Well being  - NST daily - MFM consult placed for Monday  2. High risk OB: - Regular diet, saline lock - Bathroom privileges  - Activity as tolerated    4. pPROM - Routine antepartum care  - Will continue 5 days of oral latency antibiotics.  - Plan for delivery at 34 weeks  - Anticipate vaginal delivery  Clydene Laming, CNM 12/03/2022 10:09 AM  Linda Hedges Certified Nurse Midwife [facility]

## 2022-12-03 NOTE — Progress Notes (Signed)
S: Pt reports increasing intensity of her UCs O:  SVE:    Dilation: 1cm  Effacement: 50%  Station:  Floating  Consistency: firm  Position: posterior  Membrane status: pPROM Amniotic color: Clear  Baby A Baseline FHR: 140 beats/min Variability: moderate Accelerations: present Decelerations: absent   Baby B NST: Baseline FHR: 145 beats/min Variability: moderate Accelerations: present Decelerations: absent  Tocometry: 4-6, mild contractions  Wet prep, genital     Status: None   Collection Time: 12/03/22  2:04 PM  Result Value Ref Range   Yeast Wet Prep HPF POC NONE SEEN NONE SEEN   Trich, Wet Prep NONE SEEN NONE SEEN   Clue Cells Wet Prep HPF POC NONE SEEN NONE SEEN   WBC, Wet Prep HPF POC <10 <10   Sperm NONE SEEN    WBC 4.0 - 10.5 K/uL 13.7 High  13.0 High  12.3 High  15.2 High  14.1 High   14.7 High      A: 26yo G2P1 at [redacted]w[redacted]d with pPROM and Mono/Di twin pregnancy P:  Dr. Leafy Ro up dated, she asked for wet prep, CBC, and SVE Will continue to monitor for active labor  Clydene Laming 12/03/2022 2:55 PM

## 2022-12-03 NOTE — OB Triage Note (Signed)
Kurstie transported to L&D for monitoring. Monitors applied & assessing.

## 2022-12-03 NOTE — Progress Notes (Signed)
S: Pt reports UCs have greatly decreased in timing and intensity O:  UCs every 6-8 mins mild    A: 27yo G2P1 at [redacted]w[redacted]d with pPROM and Mono/Di twin pregnancy P:  Transfer pt back to [employer] E Jamillah Camilo 12/03/2022 4:24 PM

## 2022-12-03 NOTE — Progress Notes (Signed)
S: Pt reports UCs have started back and she is also feeling pressure.   O:  UCs every 2-5 mins  Spotting Cat I strip x2 No cervical change    12/03/2022   11:09 PM 12/03/2022    7:58 PM 12/03/2022    4:35 PM  Vitals with BMI  Systolic [id number]  Diastolic 72 79 78  Pulse [id number] 122 111      A: 26yo G2P1 at [redacted]w[redacted]d with pPROM and Mono/Di twin pregnancy P:  Transfer to L&D for extended monitoring Dr. Leafy Ro notified of change in status and situation  [place] 12/03/2022 11:42 PM

## 2022-12-03 NOTE — Progress Notes (Signed)
Pt brought to OBS1 fro mother baby due to increased abd pain. Pt reports intermittent pain 7/10. Reports left mid abdominal pain that she describes as burning. VSS. Reports still leaking clear/pink tinged fluid. +FM.

## 2022-12-04 LAB — CBC
HCT: 33.6 % — ABNORMAL LOW (ref 36.0–46.0)
Hemoglobin: 10.4 g/dL — ABNORMAL LOW (ref 12.0–15.0)
MCH: 27 pg (ref 26.0–34.0)
MCHC: 31 g/dL (ref 30.0–36.0)
MCV: 87.3 fL (ref 80.0–100.0)
Platelets: 313 10*3/uL (ref 150–400)
RBC: 3.85 MIL/uL — ABNORMAL LOW (ref 3.87–5.11)
RDW: 12.4 % (ref 11.5–15.5)
WBC: 14.6 10*3/uL — ABNORMAL HIGH (ref 4.0–10.5)
nRBC: 0 % (ref 0.0–0.2)

## 2022-12-04 NOTE — Progress Notes (Signed)
Dr Leafy Ro making rounds to see pt/notified of increase in pts HR/no new orders received

## 2022-12-04 NOTE — Progress Notes (Addendum)
S: Pt reports UCs have minimized and pressure has resolved. No addition bleeding through the night. O:  UCs every 6-9 mins  Cat I strip x2    12/03/2022   11:09 PM 12/03/2022    7:58 PM 12/03/2022    4:35 PM  Vitals with BMI  Systolic [id number]  Diastolic 72 79 78  Pulse [id number] 122 111      A: 26yo G2P1 at [redacted]w[redacted]d with pPROM and Mono/Di twin pregnancy P:  Transfer back to [employer] E [place] 12/04/2022 11:54 AM

## 2022-12-04 NOTE — Progress Notes (Signed)
Ok for pt to ride in w/c down to lobby with family to see her child

## 2022-12-05 ENCOUNTER — Other Ambulatory Visit: Payer: Self-pay

## 2022-12-05 ENCOUNTER — Encounter: Payer: Self-pay | Admitting: Obstetrics and Gynecology

## 2022-12-05 ENCOUNTER — Encounter: Admission: RE | Disposition: A | Discharge status: Home / Self Care | Payer: Self-pay

## 2022-12-05 ENCOUNTER — Inpatient Hospital Stay (HOSPITAL_BASED_OUTPATIENT_CLINIC_OR_DEPARTMENT_OTHER): Payer: Medicaid Other

## 2022-12-05 ENCOUNTER — Inpatient Hospital Stay: Payer: Medicaid Other | Admitting: Certified Registered"

## 2022-12-05 DIAGNOSIS — Z3A32 32 weeks gestation of pregnancy: Secondary | ICD-10-CM

## 2022-12-05 DIAGNOSIS — O42113 Preterm premature rupture of membranes, onset of labor more than 24 hours following rupture, third trimester: Secondary | ICD-10-CM | POA: Diagnosis not present

## 2022-12-05 DIAGNOSIS — O4590 Premature separation of placenta, unspecified, unspecified trimester: Secondary | ICD-10-CM | POA: Diagnosis not present

## 2022-12-05 DIAGNOSIS — O4593 Premature separation of placenta, unspecified, third trimester: Secondary | ICD-10-CM | POA: Diagnosis not present

## 2022-12-05 DIAGNOSIS — O30033 Twin pregnancy, monochorionic/diamniotic, third trimester: Secondary | ICD-10-CM | POA: Diagnosis not present

## 2022-12-05 DIAGNOSIS — O30003 Twin pregnancy, unspecified number of placenta and unspecified number of amniotic sacs, third trimester: Secondary | ICD-10-CM

## 2022-12-05 DIAGNOSIS — O0990 Supervision of high risk pregnancy, unspecified, unspecified trimester: Secondary | ICD-10-CM

## 2022-12-05 LAB — CBC
HCT: 36 % (ref 36.0–46.0)
Hemoglobin: 11.2 g/dL — ABNORMAL LOW (ref 12.0–15.0)
MCH: 26.9 pg (ref 26.0–34.0)
MCHC: 31.1 g/dL (ref 30.0–36.0)
MCV: 86.5 fL (ref 80.0–100.0)
Platelets: 342 10*3/uL (ref 150–400)
RBC: 4.16 MIL/uL (ref 3.87–5.11)
RDW: 12.5 % (ref 11.5–15.5)
WBC: 13.7 10*3/uL — ABNORMAL HIGH (ref 4.0–10.5)
nRBC: 0 % (ref 0.0–0.2)

## 2022-12-05 LAB — TYPE AND SCREEN
ABO/RH(D): O POS
Antibody Screen: NEGATIVE

## 2022-12-05 LAB — VARICELLA ZOSTER ANTIBODY, IGG: Varicella IgG: 190 index (ref >165)

## 2022-12-05 SURGERY — Surgical Case
Anesthesia: Spinal

## 2022-12-05 MED ORDER — DEXAMETHASONE SODIUM PHOSPHATE 10 MG/ML IJ SOLN
INTRAMUSCULAR | Status: AC
  Filled 2022-12-05: qty 1

## 2022-12-05 MED ORDER — ACETAMINOPHEN 500 MG PO TABS: 1000.0000 mg | ORAL_TABLET | Freq: Four times a day (QID) | ORAL | Status: DC | PRN

## 2022-12-05 MED ORDER — CEFAZOLIN SODIUM-DEXTROSE 2-4 GM/100ML-% IV SOLN
2.0000 g | INTRAVENOUS | Status: AC
  Administered 2022-12-05: 2 g via INTRAVENOUS
  Filled 2022-12-05: qty 100

## 2022-12-05 MED ORDER — PRENATAL MULTIVITAMIN CH
1.0000 | ORAL_TABLET | Freq: Every day | ORAL | Status: DC
  Administered 2022-12-06 – 2022-12-08 (×3): 1 via ORAL
  Filled 2022-12-05 (×3): qty 1

## 2022-12-05 MED ORDER — BUPIVACAINE IN DEXTROSE 0.75-8.25 % IT SOLN
INTRATHECAL | Status: DC | PRN
  Administered 2022-12-05: 1.4 mL via INTRATHECAL

## 2022-12-05 MED ORDER — NALOXONE HCL 0.4 MG/ML IJ SOLN: 0.4000 mg | INTRAMUSCULAR | Status: DC | PRN

## 2022-12-05 MED ORDER — GABAPENTIN 300 MG PO CAPS
ORAL_CAPSULE | ORAL | Status: AC
  Administered 2022-12-05: 300 mg
  Filled 2022-12-05: qty 1

## 2022-12-05 MED ORDER — PHENYLEPHRINE HCL-NACL 20-0.9 MG/250ML-% IV SOLN
INTRAVENOUS | Status: DC | PRN
  Administered 2022-12-05: 50 ug/min via INTRAVENOUS

## 2022-12-05 MED ORDER — PROPOFOL 10 MG/ML IV BOLUS
INTRAVENOUS | Status: AC
  Filled 2022-12-05: qty 20

## 2022-12-05 MED ORDER — BUPIVACAINE HCL (PF) 0.5 % IJ SOLN
INTRAMUSCULAR | Status: DC | PRN
  Administered 2022-12-05: 60 mL

## 2022-12-05 MED ORDER — KETOROLAC TROMETHAMINE 30 MG/ML IJ SOLN
30.0000 mg | Freq: Four times a day (QID) | INTRAMUSCULAR | Status: AC
  Administered 2022-12-05 – 2022-12-06 (×4): 30 mg via INTRAVENOUS
  Filled 2022-12-05 (×4): qty 1

## 2022-12-05 MED ORDER — SODIUM CHLORIDE (PF) 0.9 % IJ SOLN
INTRAMUSCULAR | Status: DC | PRN
  Administered 2022-12-05: 10 mL

## 2022-12-05 MED ORDER — MENTHOL 3 MG MT LOZG: 1.0000 | LOZENGE | OROMUCOSAL | Status: DC | PRN

## 2022-12-05 MED ORDER — ONDANSETRON HCL 4 MG/2ML IJ SOLN: 4.0000 mg | Freq: Three times a day (TID) | INTRAMUSCULAR | Status: DC | PRN

## 2022-12-05 MED ORDER — ENOXAPARIN SODIUM 40 MG/0.4ML IJ SOSY
40.0000 mg | PREFILLED_SYRINGE | INTRAMUSCULAR | Status: DC
  Administered 2022-12-06 – 2022-12-08 (×3): 40 mg via SUBCUTANEOUS
  Filled 2022-12-05 (×3): qty 0.4

## 2022-12-05 MED ORDER — FENTANYL CITRATE (PF) 100 MCG/2ML IJ SOLN
INTRAMUSCULAR | Status: AC
  Filled 2022-12-05: qty 2

## 2022-12-05 MED ORDER — SODIUM CHLORIDE 0.9% FLUSH: 3.0000 mL | INTRAVENOUS | Status: DC | PRN

## 2022-12-05 MED ORDER — OXYTOCIN-SODIUM CHLORIDE 30-0.9 UT/500ML-% IV SOLN: 2.5000 [IU]/h | INTRAVENOUS | Status: DC

## 2022-12-05 MED ORDER — SENNOSIDES-DOCUSATE SODIUM 8.6-50 MG PO TABS
2.0000 | ORAL_TABLET | Freq: Every day | ORAL | Status: DC
  Administered 2022-12-06 – 2022-12-08 (×3): 2 via ORAL
  Filled 2022-12-05 (×3): qty 2

## 2022-12-05 MED ORDER — SODIUM CHLORIDE (PF) 0.9 % IJ SOLN
INTRAMUSCULAR | Status: AC
  Filled 2022-12-05: qty 50

## 2022-12-05 MED ORDER — KETOROLAC TROMETHAMINE 30 MG/ML IJ SOLN
INTRAMUSCULAR | Status: DC | PRN
  Administered 2022-12-05: 30 mg via INTRAVENOUS

## 2022-12-05 MED ORDER — DIBUCAINE (PERIANAL) 1 % EX OINT: 1.0000 | TOPICAL_OINTMENT | CUTANEOUS | Status: DC | PRN

## 2022-12-05 MED ORDER — SIMETHICONE 80 MG PO CHEW: 80.0000 mg | CHEWABLE_TABLET | ORAL | Status: DC | PRN

## 2022-12-05 MED ORDER — KETOROLAC TROMETHAMINE 30 MG/ML IJ SOLN: 30.0000 mg | Freq: Four times a day (QID) | INTRAMUSCULAR | Status: DC | PRN

## 2022-12-05 MED ORDER — IBUPROFEN 600 MG PO TABS
600.0000 mg | ORAL_TABLET | Freq: Four times a day (QID) | ORAL | Status: DC
  Administered 2022-12-06 – 2022-12-07 (×2): 600 mg via ORAL
  Filled 2022-12-05 (×2): qty 1

## 2022-12-05 MED ORDER — COCONUT OIL OIL
1.0000 | TOPICAL_OIL | Status: DC | PRN
  Administered 2022-12-06: 2 via TOPICAL
  Filled 2022-12-05: qty 15

## 2022-12-05 MED ORDER — GABAPENTIN 300 MG PO CAPS: 300.0000 mg | ORAL_CAPSULE | Freq: Every day | ORAL | Status: DC

## 2022-12-05 MED ORDER — ONDANSETRON HCL 4 MG/2ML IJ SOLN
INTRAMUSCULAR | Status: DC | PRN
  Administered 2022-12-05: 4 mg via INTRAVENOUS

## 2022-12-05 MED ORDER — MORPHINE SULFATE (PF) 0.5 MG/ML IJ SOLN
INTRAMUSCULAR | Status: DC | PRN
  Administered 2022-12-05: .1 mg via INTRATHECAL

## 2022-12-05 MED ORDER — SOD CITRATE-CITRIC ACID 500-334 MG/5ML PO SOLN
30.0000 mL | ORAL | Status: AC
  Administered 2022-12-05: 30 mL via ORAL

## 2022-12-05 MED ORDER — ACETAMINOPHEN 500 MG PO TABS: 1000.0000 mg | ORAL_TABLET | Freq: Four times a day (QID) | ORAL | Status: DC

## 2022-12-05 MED ORDER — DIPHENHYDRAMINE HCL 25 MG PO CAPS: 25.0000 mg | ORAL_CAPSULE | Freq: Four times a day (QID) | ORAL | Status: DC | PRN

## 2022-12-05 MED ORDER — BUPIVACAINE HCL (PF) 0.5 % IJ SOLN
30.0000 mL | Freq: Once | INTRAMUSCULAR | Status: DC
  Filled 2022-12-05: qty 30

## 2022-12-05 MED ORDER — OXYTOCIN-SODIUM CHLORIDE 30-0.9 UT/500ML-% IV SOLN
INTRAVENOUS | Status: AC
  Filled 2022-12-05: qty 500

## 2022-12-05 MED ORDER — OXYTOCIN-SODIUM CHLORIDE 30-0.9 UT/500ML-% IV SOLN
INTRAVENOUS | Status: DC | PRN
  Administered 2022-12-05: 30 [IU] via INTRAVENOUS

## 2022-12-05 MED ORDER — ACETAMINOPHEN 500 MG PO TABS
1000.0000 mg | ORAL_TABLET | Freq: Four times a day (QID) | ORAL | Status: DC
  Administered 2022-12-05 – 2022-12-07 (×6): 1000 mg via ORAL
  Filled 2022-12-05 (×6): qty 2

## 2022-12-05 MED ORDER — KETOROLAC TROMETHAMINE 30 MG/ML IJ SOLN
INTRAMUSCULAR | Status: AC
  Filled 2022-12-05: qty 1

## 2022-12-05 MED ORDER — LACTATED RINGERS IV SOLN: INTRAVENOUS | Status: DC | PRN

## 2022-12-05 MED ORDER — DIPHENHYDRAMINE HCL 50 MG/ML IJ SOLN: 12.5000 mg | INTRAMUSCULAR | Status: DC | PRN

## 2022-12-05 MED ORDER — DIPHENHYDRAMINE HCL 25 MG PO CAPS: 25.0000 mg | ORAL_CAPSULE | ORAL | Status: DC | PRN

## 2022-12-05 MED ORDER — PHENYLEPHRINE HCL-NACL 20-0.9 MG/250ML-% IV SOLN
INTRAVENOUS | Status: AC
  Filled 2022-12-05: qty 250

## 2022-12-05 MED ORDER — SCOPOLAMINE 1 MG/3DAYS TD PT72: 1.0000 | MEDICATED_PATCH | Freq: Once | TRANSDERMAL | Status: DC

## 2022-12-05 MED ORDER — SIMETHICONE 80 MG PO CHEW
80.0000 mg | CHEWABLE_TABLET | Freq: Three times a day (TID) | ORAL | Status: DC
  Administered 2022-12-06 – 2022-12-08 (×7): 80 mg via ORAL
  Filled 2022-12-05 (×8): qty 1

## 2022-12-05 MED ORDER — SOD CITRATE-CITRIC ACID 500-334 MG/5ML PO SOLN
ORAL | Status: AC
  Filled 2022-12-05: qty 15

## 2022-12-05 MED ORDER — WITCH HAZEL-GLYCERIN EX PADS: 1.0000 | MEDICATED_PAD | CUTANEOUS | Status: DC | PRN

## 2022-12-05 MED ORDER — SODIUM CHLORIDE 0.9 % IV SOLN
500.0000 mg | INTRAVENOUS | Status: AC
  Administered 2022-12-05: 500 mg via INTRAVENOUS
  Filled 2022-12-05: qty 5

## 2022-12-05 MED ORDER — OXYCODONE HCL 5 MG PO TABS
5.0000 mg | ORAL_TABLET | ORAL | Status: DC | PRN
  Administered 2022-12-06 (×4): 5 mg via ORAL
  Administered 2022-12-07: 10 mg via ORAL
  Administered 2022-12-07: 5 mg via ORAL
  Administered 2022-12-07 – 2022-12-08 (×5): 10 mg via ORAL
  Filled 2022-12-05: qty 1
  Filled 2022-12-05 (×2): qty 2
  Filled 2022-12-05: qty 1
  Filled 2022-12-05 (×2): qty 2
  Filled 2022-12-05: qty 1
  Filled 2022-12-05: qty 2
  Filled 2022-12-05: qty 1
  Filled 2022-12-05: qty 2
  Filled 2022-12-05: qty 1
  Filled 2022-12-05: qty 2

## 2022-12-05 MED ORDER — TRANEXAMIC ACID-NACL 1000-0.7 MG/100ML-% IV SOLN
INTRAVENOUS | Status: AC
  Filled 2022-12-05: qty 100

## 2022-12-05 MED ORDER — FENTANYL CITRATE (PF) 100 MCG/2ML IJ SOLN: 25.0000 ug | INTRAMUSCULAR | Status: DC | PRN

## 2022-12-05 MED ORDER — NALOXONE HCL 4 MG/10ML IJ SOLN: 1.0000 ug/kg/h | INTRAVENOUS | Status: DC | PRN

## 2022-12-05 MED ORDER — OXYCODONE HCL 5 MG/5ML PO SOLN: 5.0000 mg | Freq: Once | ORAL | Status: DC | PRN

## 2022-12-05 MED ORDER — MEPERIDINE HCL 25 MG/ML IJ SOLN: 6.2500 mg | INTRAMUSCULAR | Status: DC | PRN

## 2022-12-05 MED ORDER — MORPHINE SULFATE (PF) 2 MG/ML IV SOLN: 1.0000 mg | INTRAVENOUS | Status: DC | PRN

## 2022-12-05 MED ORDER — OXYCODONE HCL 5 MG PO TABS: 5.0000 mg | ORAL_TABLET | Freq: Once | ORAL | Status: DC | PRN

## 2022-12-05 MED ORDER — ZOLPIDEM TARTRATE 5 MG PO TABS: 5.0000 mg | ORAL_TABLET | Freq: Every evening | ORAL | Status: DC | PRN

## 2022-12-05 MED ORDER — MORPHINE SULFATE (PF) 0.5 MG/ML IJ SOLN
INTRAMUSCULAR | Status: AC
  Filled 2022-12-05: qty 10

## 2022-12-05 MED ORDER — FENTANYL CITRATE (PF) 100 MCG/2ML IJ SOLN
INTRAMUSCULAR | Status: DC | PRN
  Administered 2022-12-05: 15 ug via INTRATHECAL

## 2022-12-05 MED ORDER — ONDANSETRON HCL 4 MG/2ML IJ SOLN
INTRAMUSCULAR | Status: AC
  Filled 2022-12-05: qty 2

## 2022-12-05 MED ORDER — SODIUM CHLORIDE 0.9% FLUSH: 50.0000 mL | Freq: Once | INTRAVENOUS | Status: DC

## 2022-12-05 MED ORDER — ACETAMINOPHEN 500 MG PO TABS
1000.0000 mg | ORAL_TABLET | Freq: Once | ORAL | Status: AC
  Administered 2022-12-05: 1000 mg via ORAL
  Filled 2022-12-05: qty 2

## 2022-12-05 SURGICAL SUPPLY — 28 items
BARRIER ADHS 3X4 INTERCEED (GAUZE/BANDAGES/DRESSINGS) ×1 IMPLANT
CHLORAPREP W/TINT 26 (MISCELLANEOUS) ×1 IMPLANT
DRSG TELFA 3X8 NADH STRL (GAUZE/BANDAGES/DRESSINGS) ×1 IMPLANT
ELECT CAUTERY BLADE 6.4 (BLADE) ×1 IMPLANT
ELECT REM PT RETURN 9FT ADLT (ELECTROSURGICAL) ×1
ELECTRODE REM PT RTRN 9FT ADLT (ELECTROSURGICAL) ×1 IMPLANT
GAUZE SPONGE 4X4 12PLY STRL (GAUZE/BANDAGES/DRESSINGS) ×1 IMPLANT
GLOVE SURG SYN 8.0 (GLOVE) ×1 IMPLANT
GLOVE SURG SYN 8.0 PF PI (GLOVE) ×1 IMPLANT
GOWN STRL REUS W/ TWL LRG LVL3 (GOWN DISPOSABLE) ×2 IMPLANT
GOWN STRL REUS W/ TWL XL LVL3 (GOWN DISPOSABLE) ×1 IMPLANT
GOWN STRL REUS W/TWL LRG LVL3 (GOWN DISPOSABLE) ×2
GOWN STRL REUS W/TWL XL LVL3 (GOWN DISPOSABLE) ×1
MANIFOLD NEPTUNE II (INSTRUMENTS) ×1 IMPLANT
MAT PREVALON FULL STRYKER (MISCELLANEOUS) ×1 IMPLANT
NEEDLE HYPO 22GX1.5 SAFETY (NEEDLE) ×1 IMPLANT
NS IRRIG 1000ML POUR BTL (IV SOLUTION) ×1 IMPLANT
PACK C SECTION AR (MISCELLANEOUS) ×1 IMPLANT
PAD OB MATERNITY 4.3X12.25 (PERSONAL CARE ITEMS) ×1 IMPLANT
PAD PREP 24X41 OB/GYN DISP (PERSONAL CARE ITEMS) ×1 IMPLANT
SCRUB CHG 4% DYNA-HEX 4OZ (MISCELLANEOUS) ×1 IMPLANT
STRAP SAFETY 5IN WIDE (MISCELLANEOUS) ×1 IMPLANT
SUT CHROMIC 1 CTX 36 (SUTURE) ×3 IMPLANT
SUT PLAIN GUT 0 (SUTURE) ×2 IMPLANT
SUT VIC AB 0 CT1 36 (SUTURE) ×2 IMPLANT
SYR 30ML LL (SYRINGE) ×2 IMPLANT
TRAP FLUID SMOKE EVACUATOR (MISCELLANEOUS) ×1 IMPLANT
WATER STERILE IRR 500ML POUR (IV SOLUTION) ×1 IMPLANT

## 2022-12-05 NOTE — Anesthesia Procedure Notes (Signed)
Spinal  Patient location during procedure: OR Start time: 12/05/2022 3:10 PM End time: 12/05/2022 3:19 PM Reason for block: surgical anesthesia Staffing Performed: other anesthesia staff and resident/CRNA  Resident/CRNA: Lia Foyer, CRNA Other anesthesia staff: Lenord Fellers, RN Performed by: Lia Foyer, CRNA Authorized by: Darrin Nipper, MD   Preanesthetic Checklist Completed: patient identified, IV checked, site marked, risks and benefits discussed, surgical consent, monitors and equipment checked and pre-op evaluation Spinal Block Patient position: sitting Prep: DuraPrep Patient monitoring: heart rate, cardiac monitor, continuous pulse ox and blood pressure Approach: midline Location: L3-4 Injection technique: single-shot Needle Needle type: Pencan  Needle gauge: 25 G Needle length: 9 cm Assessment Sensory level: T4 Events: CSF return

## 2022-12-05 NOTE — Discharge Summary (Signed)
Obstetrical Discharge Summary  Patient Name: Alice [name] DOB: 13-Oct-1995 MRN: [id number]  Date of Admission: 12/02/2022 Date of Delivery: 12/05/2022 Delivered by: Dr. Laverta [name]  Date of Discharge: 12/08/2022  Primary OB:  [facility] [id number] last menstrual period was 05/02/2022 (approximate). EDC Estimated Date of Delivery: 01/28/23 Gestational Age at Delivery: [redacted]w[redacted]d   Antepartum complications:  pPROM at [redacted]w[redacted]d Mono/di twin gestation  Maternal iron deficiency anemia  Vitamin D deficiency)  Admitting Diagnosis: Preterm premature rupture of membranes (PPROM) with onset of labor after 24 hours of rupture in third trimester, antepartum [O42.113]  Secondary Diagnosis: Patient Active Problem List   Diagnosis Date Noted   Premature separation of placenta, antepartum 12/05/2022   Monochorionic diamniotic twin pregnancy in third trimester 12/05/2022   Cesarean delivery delivered 12/05/2022   Preterm premature rupture of membranes (PPROM) with onset of labor after 24 hours of rupture in third trimester, antepartum 12/02/2022   Preterm labor 11/29/2022    Discharge Diagnosis: Preterm Pregnancy Delivered      Induction: N/A Complications: Placental Abruption Intrapartum complications/course: Alice [name] was admitted for antepartum monitoring d/t pPROM. Concern for placental abruption was noted on ultrasound during a scheduled visit with MFM.  Decision made to proceed with delivery.  Please see OP note for further details.  Delivery Type: primary cesarean section, low transverse incision Anesthesia: spinal anesthesia Placenta: manual removal To Pathology: Yes  Laceration: n/a Episiotomy: none Newborn Data:   Alice, [name] [id number]  Live born female  Birth Weight:   APGAR: ,   Newborn Delivery   Birth date/time: 12/05/2022 15:43:00 Delivery type: C-Section, Low Transverse Trial of labor: No C-section categorization: Primary       Alice, [name] [id number]  Live born female  Birth Weight: 3 lb 6 oz (1530 g) APGAR: 37, 8  Newborn Delivery   Birth date/time: 12/05/2022 15:45:23 Delivery type: C-Section, Low Transverse Trial of labor: No C-section categorization: Primary      Postpartum Procedures: none Edinburgh:     12/07/2022    8:45 AM 02/18/2018   12:11 PM  Edinburgh Postnatal Depression Scale Screening Tool  I have been able to laugh and see the funny side of things. 0 0  I have looked forward with enjoyment to things. 0 0  I have blamed myself unnecessarily when things went wrong. 2 2  I have been anxious or worried for no good reason. 1 1  I have felt scared or panicky for no good reason. 1 0  Things have been getting on top of me. 1 0  I have been so unhappy that I have had difficulty sleeping. 1 0  I have felt sad or miserable. 1 0  I have been so unhappy that I have been crying. 1 0  The thought of harming myself has occurred to me. 0 0  Edinburgh Postnatal Depression Scale Total 8 3     Post partum course:  Patient had an uncomplicated postpartum course.  By time of discharge on POD#3, her pain was controlled on oral pain medications; she had appropriate lochia and was ambulating, voiding without difficulty, tolerating regular diet and passing flatus.   She was deemed stable for discharge to home.    Discharge Physical Exam:  BP 113/73 (BP Location: Left Arm)   Pulse 93   Temp 98 F (36.7 C) (Oral)   Resp 18   Ht 5\' 2"  (1.575 m)   Wt 69.4 kg   LMP 05/02/2022 (Approximate)   SpO2 100%  Breastfeeding Unknown   BMI 27.98 kg/m   General: NAD CV: RRR Pulm: CTABL, nl effort ABD: s/nd/nt, fundus firm and below the umbilicus Lochia: moderate Perineum: minimal edema/intact Incision: c/d/I, covered with occlusive OP site dressing  DVT Evaluation: LE non-ttp, no evidence of DVT on exam.  Hemoglobin  Date Value Ref Range Status  12/06/2022 10.1 (L) 12.0 - 15.0 g/dL Final  08/01/2022 10.6  Final    HCT  Date Value Ref Range Status  12/06/2022 32.5 (L) 36.0 - 46.0 % Final    Risk assessment for postpartum VTE and prophylactic treatment: Very high risk factors: None High risk factors: None Moderate risk factors: Multiple gestation  and Cesarean delivery   Postpartum VTE prophylaxis with LMWH not indicated  Disposition: stable, discharge to home. Baby Feeding: expressed breast milk Baby Disposition: SCN d/t prematurity   Rh Immune globulin indicated: No Rubella vaccine given: was not indicated Varivax vaccine given: was not indicated Flu vaccine given in AP setting: Yes  Tdap vaccine given in AP setting: No  Contraception:  TBD  Prenatal Labs:  Blood type/Rh O POS   Antibody screen Negative   Rubella Immune (11/13 0000)   Varicella Negative   RPR NON REACTIVE (03/12 1308)   HBsAg Negative (11/13 0000)  Hep C NR   HIV Non-reactive (11/13 0000)   GC neg  Chlamydia neg  Genetic screening cfDNA negative   1 hour GTT Not done - A1C at 28 weeks 4.8  3 hour GTT N/A  GBS NEGATIVE/-- (03/12 0952)     Plan:  Alice [name] was discharged to home in good condition.   Discharge Medications: Allergies as of 12/08/2022   No Known Allergies      Medication List     TAKE these medications    acetaminophen 500 MG tablet Commonly known as: TYLENOL Take 1 tablet (500 mg total) by mouth every 6 (six) hours as needed (for pain scale < 4  OR  temperature  >/=  100.5 F).   cholecalciferol 25 MCG (1000 UNIT) tablet Commonly known as: VITAMIN D3 Take 1,000 Units by mouth daily.   ferrous sulfate 324 MG Tbec Take 324 mg by mouth daily with breakfast.   ibuprofen 600 MG tablet Commonly known as: ADVIL Take 1 tablet (600 mg total) by mouth every 6 (six) hours as needed.   oxyCODONE 5 MG immediate release tablet Commonly known as: Oxy IR/ROXICODONE Take 1-2 tablets (5-10 mg total) by mouth every 6 (six) hours as needed for moderate pain.   prenatal multivitamin  Tabs tablet Take 1 tablet by mouth daily at 12 noon.   senna-docusate 8.6-50 MG tablet Commonly known as: Senokot-S Take 2 tablets by mouth at bedtime.         Follow-up Information     [name], Alice Her, MD. Schedule an appointment as soon as possible for a visit in 2 week(s).   Specialty: Obstetrics and Gynecology Why: post-op incision check Contact information: [address] [place] [address] [phone]                 Signed: Clydene Laming 12/08/2022 10:06 AM

## 2022-12-05 NOTE — Transfer of Care (Signed)
Immediate Anesthesia Transfer of Care Note  Patient: Alice [name]  Procedure(s) Performed: CESAREAN SECTION  Patient Location: Mother/Baby  Anesthesia Type:Spinal  Level of Consciousness: awake and patient cooperative  Airway & Oxygen Therapy: Patient Spontanous Breathing  Post-op Assessment: Report given to RN and Post -op Vital signs reviewed and stable  Post vital signs: Reviewed and stable  Last Vitals:  Vitals Value Taken Time  BP 114/74 (86)   Temp    Pulse 99   Resp 11   SpO2 100 %     Last Pain:  Vitals:   12/05/22 1351  TempSrc: Oral  PainSc: 0-No pain      Patients Stated Pain Goal: 2 ([id number])  Complications: No notable events documented.

## 2022-12-05 NOTE — Brief Op Note (Signed)
12/05/2022  4:20 PM  PATIENT:  Alice [name]  27 y.o. female  PRE-OPERATIVE DIAGNOSIS: 32+[redacted] week EGA , Monochorionic / diamniotic twin gestation  primary cesarean section due to partial placental abruption  POST-OPERATIVE DIAGNOSIS:same . Vtx / breech presentation   PROCEDURE:  Procedure(s): CESAREAN SECTION. Primary LTCS   SURGEON:  Surgeon(s) and Role:    * Ilia Engelbert, Gwen Her, MD - Primary  PHYSICIAN ASSISTANT: Drinda Butts , cnm   ASSISTANTS: none   ANESTHESIA:   spinal  EBL: QBL=305 cc IOF+ 800 cc UO 250   BLOOD ADMINISTERED:none  DRAINS: Urinary Catheter (Foley)   LOCAL MEDICATIONS USED:  MARCAINE     SPECIMEN:  Source of Specimen:  placenta   DISPOSITION OF SPECIMEN:  PATHOLOGY  COUNTS:  YES  TOURNIQUET:  * No tourniquets in log *  DICTATION: .Other Dictation: Dictation Number verbal  PLAN OF CARE: Admit to inpatient   PATIENT DISPOSITION:  PACU - hemodynamically stable.   Delay start of Pharmacological VTE agent (>24hrs) due to surgical blood loss or risk of bleeding: not applicable

## 2022-12-05 NOTE — Progress Notes (Addendum)
Maternal-Fetal Medicine (Follow-up Consultation)  Name: Alice [name] DOB: [birth year] MRN: [id number] Referring Provider: Drinda Butts, CNM  G2 P1001 at 32w 2d gestation with monochorionic-diamniotic twin pregnancy.  Patient was admitted to [facility] [place] on 11/29/2022 with diagnosis of PPROM (twin A).  She completed betamethasone course and is on prophylactic antibiotics. She had MFM consultation at [facility] [place].  Obstetrical history is significant for a term vaginal delivery.  Her prenatal course has been, otherwise, uneventful.  Patient has been having intermittent vaginal bleeding.  On previous fetal growth assessment, concordant growth spurt seen with no evidence of fetal growth restriction.  Marginal cord insertion was seen in twin B.  Ultrasound Twin A: Maternal left, cephalic presentation, anterior placenta.  Fetal tone did not meet the criteria of BPP. Amniotic fluid is normal with good fetal activity seen.  BPP 6/8.  Twin B: Maternal right, cephalic presentation, anterior placenta.  Antenatal testing is reassuring.  Amniotic fluid is normal and good fetal activity seen.  BPP 8/8.  A moderate size retroplacental hemorrhage is seen at the inferior end of the placenta, which is consistent with placental abruption.  I compared it with the previous ultrasound and this seems to have increased in size.  Patient is still having intermittent vaginal bleeding.  I counseled the patient on the findings.  Although ultrasound has limitations in accurately diagnosing placental abruption, her symptom of vaginal bleeding and ultrasound findings raise a strong possibility of placental abruption.  Since the patient is having intermittent bleeding and an abruption seems to be increasing in size, I recommend delivery now.  Patient was made aware that placental abruption can become severe and lead to fetal death.  Patient agreed with my recommendation on delivery.  I  counseled the patient that she may attempt vaginal delivery after obtaining consent for emergency cesarean delivery if she has nonreassuring fetal heart rates.  Discussed with Drinda Butts, CNM.  Recommendations -Pitocin to be started after epidural analgesia and anesthesia consultation. -Continuous monitoring during labor and emergency cesarean consent. -Obstetrician on-call to counsel on the mode of delivery and cesarean section may performed if obstetrician prefers because of placental abruption.

## 2022-12-05 NOTE — Discharge Instructions (Signed)
?Cesarean Delivery, Care After ?Refer to this sheet in the next few weeks. These instructions provide you with information on caring for yourself after your procedure. Your health care provider may also give you specific instructions. Your treatment has been planned according to current medical practices, but problems sometimes occur. Call your health care provider if you have any problems or questions after you go home. ?HOME CARE INSTRUCTIONS  ?Please leave honey comb dressing (OP Site) on for 7 days.  You may shower during this period but turn your back to the water so that the dressing does not get directly saturated by the water.   ?You may take the dressing off on day 7.  The easiest way to do it is in the shower.  Allow the water to run over the dressing and it usually comes off easier.   ?Only take over-the-counter or prescription medications as directed by your health care provider. ?Do not drink alcohol, especially if you are breastfeeding or taking medication to relieve pain. ?Do not  smoke tobacco. ?Continue to use good perineal care. Good perineal care includes: ?Wiping your perineum from front to back. ?Keeping your perineum clean. ?Check your surgical cut (incision) daily for increased redness, drainage, swelling, or separation of skin. ?Shower and clean your incision gently with soap and water every day, by letting warm and soapy water run over the incision, and then pat it dry. If your health care provider says it is okay, leave the incision uncovered. Use a bandage (dressing) if the incision is draining fluid or appears irritated. If the adhesive strips across the incision do not fall off within 7 days, carefully peel them off, after a shower. ?Hug a pillow when coughing or sneezing until your incision is healed. This helps to relieve pain. ?Do not use tampons, douches or have sexual intercourse, until your health care provider says it is okay. ?Wear a well-fitting bra that provides breast  support. ?Limit wearing support panties or control-top hose. ?Drink enough fluids to keep your urine clear or pale yellow. ?Eat high-fiber foods such as whole grain cereals and breads, brown rice, beans, and fresh fruits and vegetables every day. These foods may help prevent or relieve constipation. ?Resume activities such as climbing stairs, driving, lifting, exercising, or traveling as directed by your health care provider. ?Try to have someone help you with your household activities and your newborn for at least a few days after you leave the hospital. ?Rest as much as possible. Try to rest or take a nap when your newborn is sleeping. ?Increase your activities gradually. ?Do not lift more than 15lbs until directed by a provider. ?Keep all of your scheduled postpartum appointments. It is very important to keep your scheduled follow-up appointments. At these appointments, your health care provider will be checking to make sure that you are healing physically and emotionally. ?SEEK MEDICAL CARE IF:  ?You are passing large clots from your vagina. Save any clots to show your health care provider. ?You have a foul smelling discharge from your vagina. ?You have trouble urinating. ?You are urinating frequently. ?You have pain when you urinate. ?You have a change in your bowel movements. ?You have increasing redness, pain, or swelling near your incision. ?You have pus draining from your incision. ?Your incision is separating. ?You have painful, hard, or reddened breasts. ?You have a severe headache. ?You have blurred vision or see spots. ?You feel sad or depressed. ?You have thoughts of hurting yourself or your newborn. ?You have questions   about your care, the care of your newborn, or medications. ?You are dizzy or light-headed. ?You have a rash. ?You have pain, redness, or swelling at the site of the removed intravenous access (IV) tube. ?You have nausea or vomiting. ?You stopped breastfeeding and have not had a  menstrual period within 12 weeks of stopping. ?You are not breastfeeding and have not had a menstrual period within 12 weeks of delivery. ?You have a fever. ?SEEK IMMEDIATE MEDICAL CARE IF: ?You have persistent pain. ?You have chest pain. ?You have shortness of breath. ?You faint. ?You have leg pain. ?You have stomach pain. ?Your vaginal bleeding saturates 2 or more sanitary pads in 1 hour. ?MAKE SURE YOU:  ?Understand these instructions. ?Will watch your condition. ?Will get help right away if you are not doing well or get worse. ?Document Released: 05/28/2002 Document Revised: 01/20/2014 Document Reviewed: 05/02/2012 ?ExitCare? Patient Information ?2015 [employer]. This information is not intended to replace advice given to you by your health care provider. Make sure you discuss any questions you have with your health care provider. ? ?

## 2022-12-05 NOTE — Progress Notes (Signed)
I have read the MFM notes and enlarging placental bleed concerning for partial placenta abruption . Mono di twins  I have discussed the findings with the patient  and I feel uncomfortable an attempt at VAginal delivery given rapid abruption may ensue . Especially given the constraints in after hours with Anesthesia support. My recommendation is for a primary cesarean section  in a controlled setting . She agrees and I have consented her for the procedure .  The risks of cesarean section discussed with the patient included but were not limited to: bleeding which may require transfusion or reoperation; infection which may require antibiotics; injury to bowel, bladder, ureters or other surrounding organs; injury to the fetus; need for additional procedures including hysterectomy in the event of a life-threatening hemorrhage; placental abnormalities wth subsequent pregnancies, incisional problems, thromboembolic phenomenon and other postoperative/anesthesia complications. The patient concurred with the proposed plan, giving informed written consent for the procedure.  . Preoperative prophylactic antibiotics and SCDs ordered on call to the OR.  To OR when ready.

## 2022-12-05 NOTE — Progress Notes (Signed)
L&D Note    Subjective:  Feeling irregular contractions and cramping   Objective:   Vitals:   12/05/22 0355 12/05/22 0826 12/05/22 1351 12/05/22 1352  BP: 109/74 122/72  118/76  Pulse: (!) 104 (!) 111  (!) 122  Resp: 18 20    Temp: 98 F (36.7 C) 98.4 F (36.9 C) 98.7 F (37.1 C)   TempSrc: Oral Oral Oral   SpO2: 100% 99%    Weight:      Height:        Current Vital Signs 24h Vital Sign Ranges  T 98.7 F (37.1 C) Temp  Avg: 98.3 F (36.8 C)  Min: 98 F (36.7 C)  Max: 98.7 F (37.1 C)  BP 118/76 BP  Min: 106/67  Max: 122/72  HR (!) 122 Pulse  Avg: 114.6  Min: 104  Max: 122  RR 20 Resp  Avg: 18.4  Min: 18  Max: 20  SaO2 99 % Room Air SpO2  Avg: 97.8 %  Min: 96 %  Max: 100 %      Gen: alert, cooperative, no distress FHR baby A: Baseline: 145 bpm, Variability: moderate, Accels: Present, Decels: none FHR baby B: Baseline: 135 bpm, Variability: moderate, Accels: Present, Decels: occasional variables  Toco: Irregular, mild contractions  SVE: Dilation: 1 Effacement (%): 50 Station: -3 Exam by:: Alice [name] CNM  Medications SCHEDULED MEDICATIONS   bupivacaine(PF)  30 mL Infiltration Once   cholecalciferol  1,000 Units Oral Daily   ferrous sulfate  325 mg Oral BID WC   gabapentin  300 mg Oral QHS   Oxytocin-Sodium Chloride       prenatal multivitamin  1 tablet Oral Q1200   sodium chloride (PF)       sodium chloride flush  50 mL Other Once   sodium citrate-citric acid       sodium citrate-citric acid  30 mL Oral 30 min Pre-Op    MEDICATION INFUSIONS   sodium chloride     azithromycin 500 mg (12/05/22 1424)    ceFAZolin (ANCEF) IV     tranexamic acid      PRN MEDICATIONS  sodium chloride, acetaminophen, calcium carbonate, Oxytocin-Sodium Chloride, sodium chloride (PF), sodium chloride flush, sodium citrate-citric acid, tranexamic acid   Assessment & Plan:  27 y.o. G2P1001 at [redacted]w[redacted]d admitted for pPROM -Labor: pPROM, mono/di twin gestation  -Fetal Well-being: Category  I/II -> overall reassuring with moderate variability and accels  -GBS: negative -Membranes pPROM 11/29/2022 at 0800 -Concerns for abruption noted on [place] today with MFM.  Delivery method discussed with Dr. Ouida [name] and Alice [name].  Recommend primary c/section d/t high risk concerns, potential for complications at delivery, and new onset abruption.  Alice [name] is agreeable with primary c/section. -Anesthesia team aware  -To OR when ready    Alice [name], CNM  12/05/2022 3:02 PM  [name]

## 2022-12-05 NOTE — Anesthesia Preprocedure Evaluation (Signed)
Anesthesia Evaluation  Patient identified by MRN, date of birth, ID band Patient awake    Reviewed: Allergy & Precautions, NPO status , Patient's Chart, lab work & pertinent test results  History of Anesthesia Complications Negative for: history of anesthetic complications  Airway Mallampati: III  TM Distance: >3 FB Neck ROM: full    Dental  (+) Chipped   Pulmonary neg pulmonary ROS, neg shortness of breath   Pulmonary exam normal        Cardiovascular Exercise Tolerance: Good (-) hypertension(-) Past MI negative cardio ROS Normal cardiovascular exam     Neuro/Psych    GI/Hepatic negative GI ROS,,,  Endo/Other    Renal/GU   negative genitourinary   Musculoskeletal   Abdominal   Peds  Hematology negative hematology ROS (+)   Anesthesia Other Findings Past Medical History: No date: Anemia  Past Surgical History: No date: NO PAST SURGERIES  BMI    Body Mass Index: 27.98 kg/m      Reproductive/Obstetrics (+) Pregnancy                             Anesthesia Physical Anesthesia Plan  ASA: 2 and emergent  Anesthesia Plan: Spinal   Post-op Pain Management:    Induction:   PONV Risk Score and Plan:   Airway Management Planned: Natural Airway and Nasal Cannula  Additional Equipment:   Intra-op Plan:   Post-operative Plan:   Informed Consent: I have reviewed the patients History and Physical, chart, labs and discussed the procedure including the risks, benefits and alternatives for the proposed anesthesia with the patient or authorized representative who has indicated his/her understanding and acceptance.     Dental Advisory Given  Plan Discussed with: Anesthesiologist, CRNA and Surgeon  Anesthesia Plan Comments: (Patient reports no bleeding problems and no anticoagulant use.  Plan for spinal with backup GA  Patient consented for risks of anesthesia including but  not limited to:  - adverse reactions to medications - damage to eyes, teeth, lips or other oral mucosa - nerve damage due to positioning  - risk of bleeding, infection and or nerve damage from spinal that could lead to paralysis - risk of headache or failed spinal - damage to teeth, lips or other oral mucosa - sore throat or hoarseness - damage to heart, brain, nerves, lungs, other parts of body or loss of life  Patient voiced understanding.)       Anesthesia Quick Evaluation

## 2022-12-05 NOTE — Op Note (Signed)
NAME: Alice [name], Alice [name] MEDICAL RECORD NO: [id number] ACCOUNT NO: [id number] DATE OF BIRTH: 14-Dec-1995 FACILITY: [facility] LOCATION: [facility] PHYSICIAN: Boykin Nearing, MD  Operative Report   DATE OF PROCEDURE: 12/05/2022  PREOPERATIVE DIAGNOSES: 1.  Twin monochorionic diamniotic twin gestation at 75+2 weeks estimated gestational age. 2.  Partial placental abruption.  POSTOPERATIVE DIAGNOSES: 1.  Twin monochorionic diamniotic twin gestation at 30+2 weeks estimated gestational age. 2.  Partial placental abruption. 3. Viable twin females, delivered.  PROCEDURE:  Primary low transverse cesarean section.  SURGEON:  Boykin Nearing, MD  FIRST ASSISTANT:  Edilia Bo, certified nurse midwife.  INDICATIONS:  This is a 27 year old female at 32+2 weeks estimated gestational age.  The patient had been previously admitted to another hospital with preterm premature rupture of membranes.  The patient was given betamethasone and IV antibiotics and was  converted to oral antibiotics and was transferred to our facility.  Maternal fetal medicine evaluation, the day of the procedure revealed an enlarging placental lake consistent with a possible placental abruption and recommended delivery.  After  consultation with the patient and risk of catastrophic abruption the patient and I decided that a primary cesarean section would be the safest course of action.  DESCRIPTION OF PROCEDURE:  After adequate spinal anesthesia, the patient was placed in dorsal supine position with hip roll on the right side.  The patient's abdomen was prepped and draped in normal sterile fashion.  The patient did receive 2 grams of IV  Ancef and 500 mg azithromycin for surgical prophylaxis.  Timeout was performed.  A Pfannenstiel incision was made 2 fingerbreadths above the symphysis pubis.  Sharp dissection was used to identify the fascia.  Fascia was scored in the midline and opened  in a transverse fashion.   Superior aspect of the fascia was grasped with Kocher clamps and the recti muscles were dissected free.  Inferior aspect of fascia was grasped with Kocher clamps and pyramidalis muscle was dissected free.  Entry into the  peritoneal cavity was accomplished sharply.  In the bladder the blade was placed and there was adequate room in the lower uterine segment to make a transverse uterine incision.  Upon entry into the endometrial cavity, clear fluid resulted and incision  was extended with blunt transverse traction.  Fetal head of baby A was brought to the incision with fundal pressure, the shoulders and body were delivered without difficulty.  Cord was doubly clamped while the baby was being dried and infant was passed  to the neonatologist.  Apgar scores were not assigned at this point.  Fetal weight 1710 grams.  Baby B's presentation was compound at that point head was initially tried to be oriented to the incision, but fetal legs were pushing in front therefore, the  legs were grasped and a breech extraction was then performed with a sweeping maneuver of the arms and the head was delivered without difficulty.  Baby B, female's cord was doubly clamped and she was passed to the neonatologist who ultimately assigned  Apgar scores of 8 and 8, fetal weight 1530 grams.  The placenta was manually delivered and the placenta will be sent to pathology for evaluation.  Cord blood was taken of both A and B babies.  Uterus was exteriorized and the endometrial cavity was wiped  clean with laparotomy tape.  Uterine incision was closed with 1 chromic suture in a running nonlocking fashion.  Good approximation of edges.  Good hemostasis noted.  Fallopian tubes and ovaries appeared normal.  Posterior cul-de-sac was irrigated and  suctioned and the uterus was placed back into the abdominal cavity and the pericolic gutters were then wiped clean with laparotomy tape.  Uterine incision again appeared hemostatic.  Interceed was  placed over the uterine incision in a T-shaped fashion  and the fascia was then closed with 0 Vicryl suture in a running nonlocking fashion good approximation of edges.  Good hemostasis noted.  Subcutaneous tissues were irrigated and bovied for hemostasis.  Fascial edges were injected with a solution of 60 mL  0.5% Marcaine plus 20 mL normal saline.  Approximately 30 mL of solution was injected.  Skin was then reapproximated with Insorb absorbable staples and additional 25 mL of Marcaine solution was injected beneath the skin.  There were no complications.  QUANTITATIVE BLOOD LOSS:  305 mL.  URINE OUTPUT:  250 mL.  INTRAOPERATIVE FLUIDS:  800 mL.  The patient was taken to recovery room in good condition. She did receive 30 mg intravenous Toradol at the end of the procedure.   PUS D: 12/05/2022 4:52:25 pm T: 12/05/2022 8:40:00 pm  JOB: [id number]

## 2022-12-05 NOTE — Consult Note (Addendum)
Neonatology Note:   Attendance at C-section:    I was asked by Dr. Ouida Sills to attend this C/S delivery at [place] 2d of mono-di twins due to concernt or placental abruption (moderate size retroplacental hemorrhage noted on today's [place], both now cephalic). The mother is a 27yo who is GBS neg with good prenatal care complicated by twins, PTL, PPOM since 31w 4d BTMZ complete 3/13 with latency abx and s/p IV Mag.    TWIN A:  ROM 151h 29m prior to delivery, fluid clear. Infant fairly vigorous with fair  spontaneous cry and tone. Sheridan not done.  Needed minimal bulb suctioning. Lungs coarse to ausc with only fair resp effort. HR >100.  Bulb suctioned and then started CPAP.  Persistent fair resp effort with waning HR <100.  SaO2 in 60s.  PPV started.  HR improved to >100 and resp effort gradually improved along with Sao2 to 90s. Stopped PPV after 6min and continued with cpap. Weaned fio2 accordingly to 21% over several minutes.  +WOB, mild hypotonia.  Maintained cpap.  Apgars 7 at 1 minute, 8 at 5 minutes.  Family updated.  To SCN for further management. No issues during transfer to unit.. Accompanied by father.    TWIN B:  ROM 151h 68m prior to delivery, fluid clear. Infant fairly vigorous with good spontaneous cry and fair tone. Blaine not done.  Needed minimal bulb suctioning. Lungs coarse but clearing to ausc, fair tone and improving pink color.  Sao2 placed and appropriate. Apgars 7 at 1 minute, 8 at 5 minutes.  Family updated.  To SCN for further management. No issues during transfer to unit.. Accompanied by father.    Monia Sabal Katherina Mires, MD Neonatologist 12/05/2022, 4:25 PM

## 2022-12-06 ENCOUNTER — Encounter: Payer: Self-pay | Admitting: Obstetrics and Gynecology

## 2022-12-06 LAB — CBC
HCT: 32.5 % — ABNORMAL LOW (ref 36.0–46.0)
Hemoglobin: 10.1 g/dL — ABNORMAL LOW (ref 12.0–15.0)
MCH: 27 pg (ref 26.0–34.0)
MCHC: 31.1 g/dL (ref 30.0–36.0)
MCV: 86.9 fL (ref 80.0–100.0)
Platelets: 338 10*3/uL (ref 150–400)
RBC: 3.74 MIL/uL — ABNORMAL LOW (ref 3.87–5.11)
RDW: 12.6 % (ref 11.5–15.5)
WBC: 14.1 10*3/uL — ABNORMAL HIGH (ref 4.0–10.5)
nRBC: 0 % (ref 0.0–0.2)

## 2022-12-06 NOTE — Progress Notes (Signed)
Patient was aware of needing to void this morning after foley was taken out. Throughout the day, pt "was very distracted" and was off the floor several times throughout the day. By the time RN was able to check if pt had voided, it had been a long period of time. Pt said "she does this all the time and forgets to take care of herself when she gets distracted." RN got pt to void and she was able to get out 751mL stating that she felt relief and will be better about voiding more often. Bleeding is still scant and fundus remains firm. Post void scan after 28mins showed around 60mL of urine. RN talked face to face with provider, Theola Sequin, and provider was aware of situation. RN was told to straight cath patient if she had a decent amount or retained urine. Nightshift aware of the plan and situation and will continue to monitor.

## 2022-12-06 NOTE — Anesthesia Postprocedure Evaluation (Signed)
Anesthesia Post Note  Patient: Alice [name]  Procedure(s) Performed: [place]  Patient location during evaluation: Mother Baby Anesthesia Type: Spinal Level of consciousness: oriented and awake and alert Pain management: pain level controlled Vital Signs Assessment: post-procedure vital signs reviewed and stable Respiratory status: spontaneous breathing and respiratory function stable Cardiovascular status: blood pressure returned to baseline and stable Postop Assessment: no headache, no backache, no apparent nausea or vomiting and able to ambulate Anesthetic complications: no  No notable events documented.   Last Vitals:  Vitals:   12/05/22 2318 12/06/22 0216  BP: 112/77 110/82  Pulse: 99 98  Resp: 20 20  Temp: 37 C 36.4 C  SpO2: 99% 99%    Last Pain:  Vitals:   12/06/22 0300  TempSrc:   PainSc: Asleep                 Caryl Asp

## 2022-12-06 NOTE — Progress Notes (Signed)
Told pt earlier today that she could take a shower and remove her dressing and I would put on a honeycomb. Pt has left the room several times and is currently resting. She said she would call out when she has a shower

## 2022-12-06 NOTE — Progress Notes (Signed)
Postop Day  1  Subjective: no complaints, up ad lib, voiding, tolerating PO, and + flatus  Doing well, no concerns. Ambulating without difficulty, pain managed with PO meds, tolerating regular diet, and voiding without difficulty.   No fever/chills, chest pain, shortness of breath, nausea/vomiting, or leg pain. No nipple or breast pain. No headache, visual changes, or RUQ/epigastric pain.  Objective: BP 102/77 (BP Location: Right Arm)   Pulse 91   Temp 98.3 F (36.8 C) (Oral)   Resp 18   Ht 5\' 2"  (1.575 m)   Wt 69.4 kg   LMP 05/02/2022 (Approximate)   SpO2 99%   Breastfeeding Unknown   BMI 27.98 kg/m    Physical Exam:  General: alert, cooperative, and appears stated age Breasts: soft/nontender CV: RRR Pulm: nl effort, CTABL Abdomen: soft, non-tender, active bowel sounds Uterine Fundus: firm Incision: healing well, no significant drainage, no dehiscence, no significant erythema Perineum: minimal edema, intact Lochia: appropriate DVT Evaluation: No evidence of DVT seen on physical exam. Negative Homan's sign. No cords or calf tenderness. No significant calf/ankle edema.  Recent Labs    12/05/22 1213 12/06/22 0703  HGB 11.2* 10.1*  HCT 36.0 32.5*  WBC 13.7* 14.1*  PLT 342 338    Assessment/Plan: 27 y.o. [id number] postpartum day # 1  -Continue routine postpartum care -Lactation consult PRN for breastfeeding   -Declined contraception -Acute blood loss anemia - hemodynamically stable and asymptomatic; start PO ferrous sulfate BID with stool softeners  -Immunization status:   all immunizations up to date   Disposition: Continue inpatient postpartum care   LOS: 4 days    ----- Avelino Leeds Certified Nurse Midwife [facility]

## 2022-12-06 NOTE — Lactation Note (Signed)
This note was copied from a baby's chart. Lactation Consultation Note  Patient Name: Girl Judeen Casablanca [id number] Date: 12/06/2022 Age:27 hours Reason for consult: Initial assessment;Preterm <34wks;Infant < 6lbs;RN request;NICU baby   Maternal Data This is mom's 2nd pregnancy, C/S for multifetal gestation, twins are 32 2/7 weeks. Mom's first baby was full term. She is an experienced breastfeeding mother. She plans on breastfeeding the twins. Has patient been taught Hand Expression?: Yes Does the patient have breastfeeding experience prior to this delivery?: Yes How long did the patient breastfeed?: 1 year  Feeding Mother's Current Feeding Choice: Breast Milk   Lactation Tools Discussed/Used Tools: Pump Breast pump type: Double-Electric Breast Pump Pump Education: Setup, frequency, and cleaning;Milk Storage Reason for Pumping: Mom of preterm twins to provide milk and ventually breastfeed her babies Pumping frequency: 8 times/24 hours Pumped volume: 0 mL  Interventions Interventions: DEBP;Education (Reviewed breastfeeding steps when baby is preterm)  Discharge [place] Program: No (Mom plans on applying to [facility] to obtain a breastpump fopr home use.) Mom does not have a breastpump for home use. Mom will apply for loaner pump and temporarily use loaner breastpump from [facility].  Consult Status Consult Status: Follow-up Date: 12/07/22 Follow-up type: In-patient  Update provided to care nurse.  Jonna Annalycia Done 12/06/2022, 4:15 PM

## 2022-12-07 MED ORDER — ACETAMINOPHEN 500 MG PO TABS
1000.0000 mg | ORAL_TABLET | Freq: Four times a day (QID) | ORAL | Status: DC
  Administered 2022-12-07 – 2022-12-08 (×5): 1000 mg via ORAL
  Filled 2022-12-07 (×5): qty 2

## 2022-12-07 MED ORDER — IBUPROFEN 600 MG PO TABS
600.0000 mg | ORAL_TABLET | Freq: Four times a day (QID) | ORAL | Status: DC
  Administered 2022-12-07 – 2022-12-08 (×5): 600 mg via ORAL
  Filled 2022-12-07 (×5): qty 1

## 2022-12-07 NOTE — Progress Notes (Signed)
Postop Day  2  Subjective: no complaints, up ad lib, voiding, and tolerating PO  Doing well, no concerns. Ambulating without difficulty, pain managed with PO meds, tolerating regular diet, and voiding without difficulty.   No fever/chills, chest pain, shortness of breath, nausea/vomiting, or leg pain. No nipple or breast pain. No headache, visual changes, or RUQ/epigastric pain.  Objective: BP 104/69 (BP Location: Left Arm)   Pulse 97   Temp 98.7 F (37.1 C) (Oral)   Resp 17   Ht 5\' 2"  (1.575 m)   Wt 69.4 kg   LMP 05/02/2022 (Approximate)   SpO2 98%   Breastfeeding Unknown   BMI 27.98 kg/m    Physical Exam:  General: alert, cooperative, and no distress Breasts: soft/nontender CV: RRR Pulm: nl effort, CTABL Abdomen: soft, non-tender, active bowel sounds Uterine Fundus: firm Incision: healing well, no significant drainage - Covered with OP site  Perineum: minimal edema, intact Lochia: appropriate DVT Evaluation: No evidence of DVT seen on physical exam.  Recent Labs    12/05/22 1213 12/06/22 0703  HGB 11.2* 10.1*  HCT 36.0 32.5*  WBC 13.7* 14.1*  PLT 342 338    Assessment/Plan: 27 y.o. [id number] postpartum day # 2  -Continue routine postpartum care -Lactation consult PRN for breastfeeding.  Infants in SCN d/t prematurity.  -Acute blood loss anemia - hemodynamically stable and asymptomatic; start PO ferrous sulfate BID with stool softeners  -Immunization status:   all immunizations up to date   Disposition: Continue inpatient postpartum care    LOS: 5 days   [name], Mallory Shirk 12/07/2022, 9:35 AM   ----- Drinda Butts  Certified Nurse Midwife [facility]

## 2022-12-07 NOTE — Lactation Note (Signed)
This note was copied from a baby's chart. Lactation Consultation Note  Patient Name: Alice [name] [id number] Date: 12/07/2022 Age:27 hours Reason for consult: Follow-up assessment;Infant < 6lbs;Preterm <34wks;NICU baby;RN request   Maternal Data- see initial consult 12/06/22 On follow-up today mom reports she is starting to obtain droplets of colostrum  and her breasts are filling. No measurable amounts as of yet. Support and encouragement provided. Has patient been taught Hand Expression?: Yes How long did the patient breastfeed?: 1 year  Feeding Mother's Current Feeding Choice: Breast Milk and Donor Milk   Lactation Tools Discussed/Used Tools: 38F feeding tube / Syringe Breast pump type: Double-Electric Breast Pump Pump Education: Setup, frequency, and cleaning;Milk Storage (Reviewed) Reason for Pumping: Twins in SCN 32 2/7 weeks, mom pumping to provide milk and eventually wants to BF Pumping frequency: 8 times/24 hours Pumped volume:  (a few drops. No measurable amounts of yet.Mom's breasts are starting to fill.)  Interventions Interventions: Education (Provided temporary loaner breastpump until mom can get to [facility] office) Reviewed tips and strategies to maximize milk production.  Discharge Discharge Education: Engorgement and breast care;Outpatient recommendation Pump:  (DEBP on loan from [facility] until mom can obtain breastpump via [facility] [place] Program: No (Mom plans to apply for [facility] in [place].)  Consult Status Consult Status: PRN Date: 12/07/22 Follow-up type: In-patient  Update provided to care nurse.  Jonna Elon Eoff 12/07/2022, 11:50 AM

## 2022-12-07 NOTE — Progress Notes (Signed)
Mom has needed reminders for certain daily cares and activities such as voiding and pumping. She has gotten better about voiding more often with no complaints. Is complaining now of breast engorgement and is pumping a decent amount of colostrum. Was able to pump 81mL to give to nursery but is still engorged after. RN gave mom an ice pack and told her to lay back with ice pack on for 15 mins. RN encouraged to continue pumping more frequently (about every 3hrs) and to massage breast while pumping. Mom shows understanding. Will relay info to night shift RN.

## 2022-12-08 LAB — SURGICAL PATHOLOGY

## 2022-12-08 MED ORDER — ACETAMINOPHEN 500 MG PO TABS: 500.0000 mg | ORAL_TABLET | Freq: Four times a day (QID) | ORAL | Status: DC | PRN

## 2022-12-08 MED ORDER — OXYCODONE HCL 5 MG PO TABS: 5.0000 mg | ORAL_TABLET | Freq: Four times a day (QID) | ORAL | 0 refills | Status: DC | PRN

## 2022-12-08 MED ORDER — IBUPROFEN 600 MG PO TABS: 600.0000 mg | ORAL_TABLET | Freq: Four times a day (QID) | ORAL | Status: DC | PRN

## 2022-12-08 MED ORDER — PRENATAL MULTIVITAMIN CH: 1.0000 | ORAL_TABLET | Freq: Every day | ORAL | Status: DC

## 2022-12-08 MED ORDER — SENNOSIDES-DOCUSATE SODIUM 8.6-50 MG PO TABS: 2.0000 | ORAL_TABLET | Freq: Every day | ORAL | Status: AC

## 2022-12-08 NOTE — Progress Notes (Signed)
Patient discharged. Discharge instructions given. Patient verbalizes understanding. Transported by axillary. 

## 2022-12-13 NOTE — Op Note (Signed)
NAME: Alice [name], Alice [name] MEDICAL RECORD NO: [id number] ACCOUNT NO: [id number] DATE OF BIRTH: August 03, 1996 FACILITY: [facility] LOCATION: [facility] PHYSICIAN: Boykin Nearing, MD  Operative Report   DATE OF PROCEDURE: 12/05/2022  PREOPERATIVE DIAGNOSES:   1.  32+2 weeks estimated gestational age. 2.  Partial placental abruption. 3.  Monochorionic diamniotic twin gestation.  POSTOPERATIVE DIAGNOSES:   1.  32+2 weeks estimated gestational age. 2.  Partial placental abruption. 3.  Monochorionic diamniotic twin gestation. 4.  Twin delivery, female and female.  PROCEDURE:  Primary low transverse cesarean section.  SURGEON:  Boykin Nearing, MD  FIRST ASSISTANT:  Edilia Bo, certified nurse midwife.  INDICATIONS:  This is a 26 year old gravida 2, para 1 patient with monochorionic diamniotic twin gestation, followed by maternal fetal medicine. On the day of the procedure, maternal fetal medicine documented an enlarging placental lake consistent with  partial abruption. After specific counseling, the patient and surgeon agreed that a primary cesarean section would be the best option versus induction of labor.  ANESTHESIA:  Spinal.  DESCRIPTION OF PROCEDURE:  After adequate spinal anesthesia, the patient was placed in dorsal supine position. Hip roll on the right side.  The patient's abdomen and vagina were prepped and draped in normal sterile fashion.  Timeout was performed.  A  Pfannenstiel incision was made 2 fingerbreadths above the symphysis pubis.  Sharp dissection was used to identify the fascia.  Fascia was opened in the midline and opened in a transverse fashion.  Superior aspect of fascia was grasped with Kocher clamps  and the recti muscles were dissected free.  Inferior aspect of fascia was grasped with Kocher clamps and the pyramidalis muscle was dissected free.  Entry into the peritoneal cavity was accomplished sharply.  Vesicouterine peritoneal fold was identified  and  the bladder was reflected inferiorly.  A low transverse uterine incision was made.  Upon entry into the endometrial cavity, clear fluid resulted.  Baby A gestational sac opened as above.  Fetal head was then delivered through the incision and female  was delivered and passed to neonatologist after the cord was doubly clamped.  Second gestational sac was identified and ruptured and slight meconium staining of membranes was noted.  Infant was in a compound presentation and ultimately fetal legs were  grasped and a breech extraction was performed.  Cord was doubly clamped and vigorous female was then passed to neonatologist.  Please review their notes for further details for Apgars and weights.  The placenta was then manually delivered and the uterus  was exteriorized and the endometrial cavity was wiped clean with laparotomy tape.  Uterine incision was closed with 1 chromic suture in a running locking fashion.  Two additional figure-of-eight sutures required for good hemostasis.  Fallopian tubes and  ovaries appeared normal.  Posterior cul-de-sac was irrigated and suctioned, and the pericolic gutters were wiped clean with laparotomy tape.  Uterus was placed back into the abdominal cavity.  Fascia was then closed with 0 Vicryl suture in a running  nonlocking fashion.  The fascial edges were injected with a solution of 60 mL 0.5% Marcaine plus 20 mL normal saline.  Approximately 50 mL of the solution was injected at the fascial layer and the skin layer.  Uterine incision again appeared hemostatic.   Interceed was placed over the uterine incision in T-shape fashion.  Fascia was then closed with 0 Vicryl suture in a running nonlocking fashion.  Fascial edges were injected with a Marcaine solution comprising of 60 mL of 0.5%  Marcaine plus 20 mL normal  saline.  30 mL of the solution was injected beneath the fascia.  Subcutaneous tissues were irrigated and bovied for hemostasis and the skin was reapproximated with  Insorb absorbable staples and approximately 20 mL of Marcaine solution was injected  beneath the skin.  There were no complications.  The patient did receive 2 grams of Ancef for surgical prophylaxis preoperatively.  QUANTITATIVE BLOOD LOSS:  305 mL  URINE OUTPUT:  250 mL  INTRAOPERATIVE FLUIDS:  800 mL  DISPOSITION:  The patient tolerated the procedure well and was sent to recovery room.   VAI D: 12/13/2022 6:08:39 pm T: 12/13/2022 8:59:00 pm  JOB: [id number]

## 2022-12-17 ENCOUNTER — Ambulatory Visit: Payer: Self-pay

## 2022-12-17 ENCOUNTER — Inpatient Hospital Stay (HOSPITAL_COMMUNITY)
Admission: AD | Admit: 2022-12-17 | Payer: Medicaid Other | Source: Home / Self Care | Admitting: Obstetrics and Gynecology

## 2022-12-17 ENCOUNTER — Inpatient Hospital Stay (HOSPITAL_COMMUNITY): Payer: Medicaid Other

## 2022-12-17 NOTE — Lactation Note (Signed)
This note was copied from a baby's chart. Lactation Consultation Note  Patient Name: Alice [name] [id number] Date: 12/17/2022 Age:27 days     Maternal Data    Feeding Nipple Type: Dr. Clement Husbands Assisted mom with positioning baby in cradle hold at left breast while baby A at right breast, baby eager and latched easily,  nursed well x 15 min with occ rest periods, tolerated well LATCH Score      See feeding flowsheet              Lactation Tools Discussed/Used Tools: Bottle;46F feeding tube / Syringe  Interventions    Discharge    Consult Status  prn    Ferol Luz 12/17/2022, 8:16 PM

## 2022-12-17 NOTE — Lactation Note (Signed)
This note was copied from a baby's chart. Lactation Consultation Note  Patient Name: Alice [name] Today's Date: 12/17/2022 Age:27 days     Maternal Data    Feeding Nipple Type: Dr. Clement Husbands Mom had already latched baby to right breast and baby was tiring when I saw her, mom has much milk production, baby tolerated feeing well   LATCH Score   See feeding flowsheet                 Lactation Tools Discussed/Used    Interventions    Discharge    Consult Status   prn   Ferol Luz 12/17/2022, 8:12 PM

## 2023-01-02 ENCOUNTER — Telehealth: Payer: Self-pay

## 2023-01-02 NOTE — Telephone Encounter (Signed)
[facility]- Discharge Call Backs-Left pt a VM about the following below. 1-Do you have any questions or concerns about yourself as you heal? 2-Any concerns or questions about your baby? 3-How was your stay at the hospital? 4-How did our team work together to care for you? You should be receiving a survey in the mail soon.   We would really appreciate it if you could fill that out for us and return it in the mail.  We value the feedback to make improvements and continue the great work we do.   If you have any questions please feel free to call me back at [phone]  

## 2023-09-20 NOTE — L&D Delivery Note (Signed)
 Delivery Note At 9:58 AM a viable female was delivered via Vaginal, Spontaneous (Presentation:   Occiput Anterior).  APGAR: 8, 9; weight  pending.   Placenta status: Spontaneous, Intact.  Cord: 3 vessels with the following complications: None.  Cord pH: n/a  Anesthesia: Epidural Episiotomy: None Lacerations: None Suture Repair: not required Est. Blood Loss (mL): 320  Mom to postpartum.  Baby to Couplet care / Skin to Skin.  Jon CINDERELLA Rummer 05/01/2024, 10:06 AM

## 2023-10-17 LAB — OB RESULTS CONSOLE HEPATITIS B SURFACE ANTIGEN: Hepatitis B Surface Ag: NEGATIVE

## 2023-10-17 LAB — HEPATITIS C ANTIBODY: HCV Ab: NEGATIVE

## 2023-10-17 LAB — OB RESULTS CONSOLE HIV ANTIBODY (ROUTINE TESTING): HIV: NONREACTIVE

## 2023-10-17 LAB — OB RESULTS CONSOLE RPR: RPR: NONREACTIVE

## 2023-10-17 LAB — OB RESULTS CONSOLE RUBELLA ANTIBODY, IGM: Rubella: IMMUNE

## 2024-04-22 LAB — OB RESULTS CONSOLE GBS: GBS: NEGATIVE

## 2024-05-01 ENCOUNTER — Inpatient Hospital Stay (HOSPITAL_COMMUNITY)

## 2024-05-01 ENCOUNTER — Inpatient Hospital Stay (HOSPITAL_COMMUNITY)
Admission: AD | Admit: 2024-05-01 | Discharge: 2024-05-02 | DRG: 807 | Disposition: A | Discharge status: Home / Self Care | Attending: Obstetrics and Gynecology | Admitting: Obstetrics and Gynecology

## 2024-05-01 ENCOUNTER — Encounter (HOSPITAL_COMMUNITY): Payer: Self-pay | Admitting: Obstetrics and Gynecology

## 2024-05-01 ENCOUNTER — Other Ambulatory Visit: Payer: Self-pay

## 2024-05-01 DIAGNOSIS — O34219 Maternal care for unspecified type scar from previous cesarean delivery: Secondary | ICD-10-CM | POA: Diagnosis present

## 2024-05-01 DIAGNOSIS — Z3A40 40 weeks gestation of pregnancy: Secondary | ICD-10-CM | POA: Diagnosis not present

## 2024-05-01 DIAGNOSIS — O26893 Other specified pregnancy related conditions, third trimester: Secondary | ICD-10-CM | POA: Diagnosis present

## 2024-05-01 DIAGNOSIS — O9902 Anemia complicating childbirth: Secondary | ICD-10-CM | POA: Diagnosis present

## 2024-05-01 LAB — TYPE AND SCREEN
ABO/RH(D): O POS
Antibody Screen: NEGATIVE

## 2024-05-01 LAB — CBC
HCT: 37.5 % (ref 36.0–46.0)
Hemoglobin: 11.4 g/dL — ABNORMAL LOW (ref 12.0–15.0)
MCH: 25.1 pg — ABNORMAL LOW (ref 26.0–34.0)
MCHC: 30.4 g/dL (ref 30.0–36.0)
MCV: 82.6 fL (ref 80.0–100.0)
Platelets: 299 K/uL (ref 150–400)
RBC: 4.54 MIL/uL (ref 3.87–5.11)
RDW: 13.2 % (ref 11.5–15.5)
WBC: 9.8 K/uL (ref 4.0–10.5)
nRBC: 0 % (ref 0.0–0.2)

## 2024-05-01 LAB — POCT FERN TEST: POCT Fern Test: NEGATIVE

## 2024-05-01 LAB — RPR: RPR Ser Ql: NONREACTIVE

## 2024-05-01 MED ORDER — SIMETHICONE 80 MG PO CHEW: 80.0000 mg | CHEWABLE_TABLET | ORAL | Status: DC | PRN

## 2024-05-01 MED ORDER — FLEET ENEMA RE ENEM
1.0000 | ENEMA | RECTAL | Status: DC | PRN
Start: 2024-05-01 — End: 2024-05-01

## 2024-05-01 MED ORDER — EPHEDRINE 5 MG/ML INJ: 10.0000 mg | INTRAVENOUS | Status: DC | PRN

## 2024-05-01 MED ORDER — ONDANSETRON HCL 4 MG PO TABS: 4.0000 mg | ORAL_TABLET | ORAL | Status: DC | PRN

## 2024-05-01 MED ORDER — TETANUS-DIPHTH-ACELL PERTUSSIS 5-2.5-18.5 LF-MCG/0.5 IM SUSY: 0.5000 mL | PREFILLED_SYRINGE | Freq: Once | INTRAMUSCULAR | Status: DC

## 2024-05-01 MED ORDER — OXYTOCIN BOLUS FROM INFUSION
333.0000 mL | Freq: Once | INTRAVENOUS | Status: AC
  Administered 2024-05-01 (×2): 333 mL via INTRAVENOUS

## 2024-05-01 MED ORDER — ACETAMINOPHEN 325 MG PO TABS: 650.0000 mg | ORAL_TABLET | ORAL | Status: DC | PRN

## 2024-05-01 MED ORDER — SENNOSIDES-DOCUSATE SODIUM 8.6-50 MG PO TABS
2.0000 | ORAL_TABLET | Freq: Every day | ORAL | Status: DC
  Administered 2024-05-01 (×2): 2 via ORAL
  Filled 2024-05-01: qty 2

## 2024-05-01 MED ORDER — PRENATAL MULTIVITAMIN CH
1.0000 | ORAL_TABLET | Freq: Every day | ORAL | Status: DC
  Administered 2024-05-01 – 2024-05-02 (×3): 1 via ORAL
  Filled 2024-05-01 (×2): qty 1

## 2024-05-01 MED ORDER — OXYCODONE HCL 5 MG PO TABS
5.0000 mg | ORAL_TABLET | ORAL | Status: DC | PRN
  Administered 2024-05-01 (×2): 5 mg via ORAL
  Filled 2024-05-01: qty 1

## 2024-05-01 MED ORDER — OXYCODONE-ACETAMINOPHEN 5-325 MG PO TABS: 2.0000 | ORAL_TABLET | ORAL | Status: DC | PRN

## 2024-05-01 MED ORDER — OXYCODONE HCL 5 MG PO TABS
5.0000 mg | ORAL_TABLET | Freq: Once | ORAL | Status: AC
  Administered 2024-05-01 (×2): 5 mg via ORAL
  Filled 2024-05-01: qty 1

## 2024-05-01 MED ORDER — ZOLPIDEM TARTRATE 5 MG PO TABS: 5.0000 mg | ORAL_TABLET | Freq: Every evening | ORAL | Status: DC | PRN

## 2024-05-01 MED ORDER — OXYCODONE-ACETAMINOPHEN 5-325 MG PO TABS: 1.0000 | ORAL_TABLET | ORAL | Status: DC | PRN

## 2024-05-01 MED ORDER — ONDANSETRON HCL 4 MG/2ML IJ SOLN: 4.0000 mg | INTRAMUSCULAR | Status: DC | PRN

## 2024-05-01 MED ORDER — FENTANYL-BUPIVACAINE-NACL 0.5-0.125-0.9 MG/250ML-% EP SOLN
12.0000 mL/h | EPIDURAL | Status: DC | PRN
  Administered 2024-05-01 (×2): 12 mL/h via EPIDURAL

## 2024-05-01 MED ORDER — LACTATED RINGERS IV SOLN: 500.0000 mL | Freq: Once | INTRAVENOUS | Status: DC

## 2024-05-01 MED ORDER — COCONUT OIL OIL
1.0000 | TOPICAL_OIL | Status: DC | PRN
  Administered 2024-05-01 (×2): 1 via TOPICAL

## 2024-05-01 MED ORDER — BENZOCAINE-MENTHOL 20-0.5 % EX AERO
1.0000 | INHALATION_SPRAY | CUTANEOUS | Status: DC | PRN
  Administered 2024-05-01 (×2): 1 via TOPICAL
  Filled 2024-05-01: qty 56

## 2024-05-01 MED ORDER — OXYTOCIN-SODIUM CHLORIDE 30-0.9 UT/500ML-% IV SOLN
2.5000 [IU]/h | INTRAVENOUS | Status: DC
  Filled 2024-05-01: qty 500

## 2024-05-01 MED ORDER — FENTANYL-BUPIVACAINE-NACL 0.5-0.125-0.9 MG/250ML-% EP SOLN
EPIDURAL | Status: AC
Start: 2024-05-01 — End: 2024-05-01
  Filled 2024-05-01: qty 250

## 2024-05-01 MED ORDER — DIPHENHYDRAMINE HCL 50 MG/ML IJ SOLN: 12.5000 mg | INTRAMUSCULAR | Status: DC | PRN

## 2024-05-01 MED ORDER — PHENYLEPHRINE 80 MCG/ML (10ML) SYRINGE FOR IV PUSH (FOR BLOOD PRESSURE SUPPORT)
80.0000 ug | PREFILLED_SYRINGE | INTRAVENOUS | Status: DC | PRN
  Filled 2024-05-01: qty 10

## 2024-05-01 MED ORDER — SOD CITRATE-CITRIC ACID 500-334 MG/5ML PO SOLN: 30.0000 mL | ORAL | Status: DC | PRN

## 2024-05-01 MED ORDER — FENTANYL CITRATE (PF) 100 MCG/2ML IJ SOLN: 50.0000 ug | INTRAMUSCULAR | Status: DC | PRN

## 2024-05-01 MED ORDER — LIDOCAINE HCL (PF) 1 % IJ SOLN
INTRAMUSCULAR | Status: DC | PRN
  Administered 2024-05-01 (×4): 5 mL via EPIDURAL

## 2024-05-01 MED ORDER — OXYCODONE HCL 5 MG PO TABS
10.0000 mg | ORAL_TABLET | ORAL | Status: DC | PRN
  Administered 2024-05-02 (×2): 10 mg via ORAL
  Filled 2024-05-01 (×2): qty 2

## 2024-05-01 MED ORDER — DIBUCAINE (PERIANAL) 1 % EX OINT
1.0000 | TOPICAL_OINTMENT | CUTANEOUS | Status: DC | PRN
Start: 2024-05-01 — End: 2024-05-02

## 2024-05-01 MED ORDER — ONDANSETRON HCL 4 MG/2ML IJ SOLN: 4.0000 mg | Freq: Four times a day (QID) | INTRAMUSCULAR | Status: DC | PRN

## 2024-05-01 MED ORDER — WITCH HAZEL-GLYCERIN EX PADS
1.0000 | MEDICATED_PAD | CUTANEOUS | Status: DC | PRN
  Administered 2024-05-01 (×2): 1 via TOPICAL

## 2024-05-01 MED ORDER — LACTATED RINGERS IV SOLN: INTRAVENOUS | Status: DC

## 2024-05-01 MED ORDER — PHENYLEPHRINE 80 MCG/ML (10ML) SYRINGE FOR IV PUSH (FOR BLOOD PRESSURE SUPPORT): 80.0000 ug | PREFILLED_SYRINGE | INTRAVENOUS | Status: DC | PRN

## 2024-05-01 MED ORDER — IBUPROFEN 600 MG PO TABS
600.0000 mg | ORAL_TABLET | Freq: Four times a day (QID) | ORAL | Status: DC
  Administered 2024-05-01 – 2024-05-02 (×7): 600 mg via ORAL
  Filled 2024-05-01 (×5): qty 1

## 2024-05-01 MED ORDER — ACETAMINOPHEN 325 MG PO TABS
650.0000 mg | ORAL_TABLET | ORAL | Status: DC | PRN
  Administered 2024-05-01 – 2024-05-02 (×3): 650 mg via ORAL
  Filled 2024-05-01 (×2): qty 2

## 2024-05-01 MED ORDER — LIDOCAINE HCL (PF) 1 % IJ SOLN: 30.0000 mL | INTRAMUSCULAR | Status: DC | PRN

## 2024-05-01 MED ORDER — LACTATED RINGERS IV SOLN
500.0000 mL | INTRAVENOUS | Status: DC | PRN
  Administered 2024-05-01 (×4): 1000 mL via INTRAVENOUS

## 2024-05-01 MED ORDER — DIPHENHYDRAMINE HCL 25 MG PO CAPS: 25.0000 mg | ORAL_CAPSULE | Freq: Four times a day (QID) | ORAL | Status: DC | PRN

## 2024-05-01 NOTE — MAU Note (Signed)
 Alice [name] is a 28 y.o. at [redacted]w[redacted]d here in MAU reporting SROM at 1600 Tues and ctxs. She was at [facility] at 1500 and was 2cm with membrane sweep. Reports good FM. Fld has been pink/red  LMP: na Onset of complaint: 1600 Pain score: 10 Vitals:   05/01/24 0154 05/01/24 0158  BP:  116/69  Pulse: 90   Resp: 18   Temp: 97.8 F (36.6 C)   SpO2: 100%      FHT: 155  Lab orders placed from triage: labor eval

## 2024-05-01 NOTE — Anesthesia Procedure Notes (Signed)
 Epidural Patient location during procedure: OB Start time: 05/01/2024 4:40 AM End time: 05/01/2024 4:50 AM  Staffing Anesthesiologist: Erma Thom SAUNDERS, MD Performed: anesthesiologist   Preanesthetic Checklist Completed: patient identified, IV checked, risks and benefits discussed, monitors and equipment checked, pre-op evaluation and timeout performed  Epidural Patient position: sitting Prep: DuraPrep Patient monitoring: heart rate, cardiac monitor, continuous pulse ox and blood pressure Approach: midline Location: L3-L4 Injection technique: LOR air  Needle:  Needle type: Tuohy  Needle gauge: 17 G Needle length: 9 cm Needle insertion depth: 4 cm Catheter type: closed end flexible Catheter size: 19 Gauge Catheter at skin depth: 9 cm Test dose: negative  Assessment Sensory level: T8  Additional Notes Patient identified. Risks/Benefits/Options discussed with patient including but not limited to bleeding, infection, nerve damage, paralysis, failed block, incomplete pain control, headache, blood pressure changes, nausea, vomiting, reactions to medication both or allergic, itching and postpartum back pain. Confirmed with bedside nurse the patient's most recent platelet count. Confirmed with patient that they are not currently taking any anticoagulation, have any bleeding history or any family history of bleeding disorders. Patient expressed understanding and wished to proceed. All questions were answered. Sterile technique was used throughout the entire procedure. Please see nursing notes for vital signs. Test dose was given through epidural catheter and negative prior to continuing to dose epidural or start infusion. Warning signs of high block given to the patient including shortness of breath, tingling/numbness in hands, complete motor block, or any concerning symptoms with instructions to call for help. Patient was given instructions on fall risk and not to get out of bed. All questions  and concerns addressed with instructions to call with any issues or inadequate analgesia.  Reason for block:procedure for pain

## 2024-05-01 NOTE — Progress Notes (Signed)
 To BS via w/c. FHR 142 when EFM removed for transfer to [facility] [place]

## 2024-05-01 NOTE — H&P (Signed)
 Alice [name] is a 28 y.o. female presenting for labor.  Contractions increasing in intensity  and frequency.   She is unsure if she had LOF and broke her water yesterday at 4 pm.  Good FM.  . OB History     Gravida  3   Para  2   Term  1   Preterm  1   AB      Living  3      SAB      IAB      Ectopic      Multiple  1   Live Births  3          Past Medical History:  Diagnosis Date   Anemia    Past Surgical History:  Procedure Laterality Date   CESAREAN SECTION  12/05/2022   Procedure: CESAREAN SECTION;  Surgeon: Alice [name];  Location: [facility] ORS;  Service: Obstetrics;;   Family History: family history is not on file. Social History:  reports that she has never smoked. She has never used smokeless tobacco. She reports that she does not drink alcohol and does not use drugs.     Maternal Diabetes: No Genetic Screening: Normal Maternal Ultrasounds/Referrals: Normal Fetal Ultrasounds or other Referrals:  None Maternal Substance Abuse:  No Significant Maternal Medications:  None Significant Maternal Lab Results:  Group B Strep negative Number of Prenatal Visits:greater than 3 verified prenatal visits Maternal Vaccinations: Other Comments:  None  Review of Systems History Dilation: 7 Effacement (%): 90 Station: -2 Exam by:: Alice Perry, RN Blood pressure 102/66, pulse 90, temperature 97.8 F (36.6 C), resp. rate 18, height 5' 3 (1.6 m), weight 73.5 kg, SpO2 99%, unknown if currently breastfeeding. Exam Physical Exam  Physical Examination: General appearance - alert, well appearing, and in no distress Chest - clear to auscultation, no wheezes, rales or rhonchi, symmetric air entry Heart - normal rate and regular rhythm Abdomen - soft, nontender, nondistended, no masses or organomegaly  EXT NEG HOMANS SIGN Prenatal labs: ABO, Rh: --/--/O POS (08/13 0301) Antibody: NEG (08/13 0301) Rubella: Immune (01/28 0000) RPR:    HBsAg:  Negative (01/28 0000)  HIV: Non-reactive (01/28 0000)  GBS: Negative/-- (08/04 0000)   Assessment/Plan: IUP AT TERM  FOR VBAC CONSENT SIGNED IN OFFICE EPIDURAL OR IV PAIN MED PRN GBS NEG   Alice [name] A Alice [name] 05/01/2024, 8:10 AM

## 2024-05-01 NOTE — H&P (Signed)
 Alice Alice [name] is a 28 y.o. female presenting for labor.  Contractions increasing in intensity  and frequency.   She is unsure if she had LOF and broke her water yesterday at 4 pm.  Good FM.  . OB History     Gravida  3   Para  2   Term  1   Preterm  1   AB      Living  3      SAB      IAB      Ectopic      Multiple  1   Live Births  3          Past Medical History:  Diagnosis Date   Anemia    Past Surgical History:  Procedure Laterality Date   CESAREAN SECTION  12/05/2022   Procedure: CESAREAN SECTION;  Surgeon: Alice Alice [name], Alice PARAS, Alice [name];  Location: [facility] ORS;  Service: Obstetrics;;   Family History: family history is not on file. Social History:  reports that she has never smoked. She has never used smokeless tobacco. She reports that she does not drink alcohol and does not use drugs.     Maternal Diabetes: No Genetic Screening: Normal Maternal Ultrasounds/Referrals: Normal Fetal Ultrasounds or other Referrals:  None Maternal Substance Abuse:  No Significant Maternal Medications:  None Significant Maternal Lab Results:  Group B Strep negative Number of Prenatal Visits:greater than 3 verified prenatal visits Maternal Vaccinations: Other Comments:  None  Review of Systems History Dilation: 7 Effacement (%): 90 Station: -2 Exam by:: Alice Perry, RN Blood pressure 102/66, pulse 90, temperature 97.8 F (36.6 C), resp. rate 18, height 5' 3 (1.6 m), weight 73.5 kg, SpO2 99%, unknown if currently breastfeeding. Exam Physical Exam  Physical Examination: General appearance - alert, well appearing, and in no distress Chest - clear to auscultation, no wheezes, rales or rhonchi, symmetric air entry Heart - normal rate and regular rhythm Abdomen - soft, nontender, nondistended, no masses or organomegaly  EXT NEG HOMANS SIGN Prenatal labs: ABO, Rh: --/--/O POS (08/13 0301) Antibody: NEG (08/13 0301) Rubella: Immune (01/28 0000) RPR:    HBsAg:  Negative (01/28 0000)  HIV: Non-reactive (01/28 0000)  GBS: Negative/-- (08/04 0000)   Assessment/Plan: IUP AT TERM  FOR VBAC CONSENT SIGNED IN OFFICE EPIDURAL OR IV PAIN MED PRN GBS NEG   Alice Alice [name] A Alice Alice [name] 05/01/2024, 8:10 AM

## 2024-05-01 NOTE — Lactation Note (Signed)
 This note was copied from a baby's chart. Lactation Consultation Note  Patient Name: Alice [name] Unijb'd Date: 05/01/2024 Age:28 hours Reason for consult: Initial assessment;Term  Per MOB, infant is breastfeeding well. Mob latched infant on her left breast using the cross cradle hold with pillow support, infant was latched with depth and still actively breastfeeding after 22 minutes when LC left the room. MOB is experience with breastfeeding see maternal data below. LC discussed hunger cues and signs of satiety with infant. MOB will continue to breastfeed infant by cues, on demand, 8-12 times within 24 hours, skin to skin. LC discussed infant's input and output. LC discussed the importance of maternal rest, meals and hydration. MOB knows to call if she has breastfeeding questions, concerns or needs latch assistance. MOB was made aware of O/P services, breastfeeding support groups, community resources, and our phone # for post-discharge questions.    Maternal Data Has patient been taught Hand Expression?: Yes Does the patient have breastfeeding experience prior to this delivery?: Yes How long did the patient breastfeed?: Per MOB, she breastfeed her 1st child for 8 months and twins for 10 months who are currently 28 year old.  Feeding Mother's Current Feeding Choice: Breast Milk and Formula  LATCH Score Latch: Grasps breast easily, tongue down, lips flanged, rhythmical sucking.  Audible Swallowing: A few with stimulation  Type of Nipple: Everted at rest and after stimulation  Comfort (Breast/Nipple): Soft / non-tender  Hold (Positioning): No assistance needed to correctly position infant at breast.  LATCH Score: 9   Lactation Tools Discussed/Used    Interventions Interventions: Breast feeding basics reviewed;Skin to skin;Support pillows;Education;Hand pump;Guidelines for Milk Supply and Pumping Schedule Handout;LC Services brochure;CDC milk storage guidelines;CDC Guidelines for  Breast Pump Cleaning  Discharge Pump: Manual (MOB does not have DEBP and [facility]ca[facility] will not give one)  Consult Status Consult Status: Follow-up Date: 05/02/24 Follow-up type: In-patient    Grayce LULLA Batter 05/01/2024, 4:39 PM

## 2024-05-01 NOTE — Progress Notes (Signed)
 FHT remotely reviewed by me cat 1 with baseline 120 Toco q2-59min

## 2024-05-01 NOTE — Anesthesia Postprocedure Evaluation (Signed)
 Anesthesia Post Note  Patient: Alice [name]  Procedure(s) Performed: AN AD HOC LABOR EPIDURAL     Patient location during evaluation: Mother Baby Anesthesia Type: Epidural Level of consciousness: awake and alert and oriented Pain management: satisfactory to patient Vital Signs Assessment: post-procedure vital signs reviewed and stable Respiratory status: respiratory function stable Cardiovascular status: stable Postop Assessment: no headache, no backache, epidural receding, patient able to bend at knees, no signs of nausea or vomiting and adequate PO intake Anesthetic complications: no   No notable events documented.  Last Vitals:  Vitals:   05/01/24 1215 05/01/24 1345  BP: (!) 121/59 117/73  Pulse: (!) 117 (!) 113  Resp: 16 18  Temp: 37 C 37.1 C  SpO2: 98% 100%    Last Pain:  Vitals:   05/01/24 1745  TempSrc:   PainSc: 7    Pain Goal: Patients Stated Pain Goal: 0 (05/01/24 0201)                 Hildegard KATHEE Ang, CRNA

## 2024-05-01 NOTE — Anesthesia Preprocedure Evaluation (Signed)
 Anesthesia Evaluation  Patient identified by MRN, date of birth, ID band Patient awake    Reviewed: Allergy & Precautions, H&P , NPO status , Patient's Chart, lab work & pertinent test results  Airway Mallampati: II  TM Distance: >3 FB Neck ROM: Full    Dental no notable dental hx.    Pulmonary neg pulmonary ROS   Pulmonary exam normal breath sounds clear to auscultation       Cardiovascular negative cardio ROS Normal cardiovascular exam Rhythm:Regular Rate:Normal     Neuro/Psych negative neurological ROS  negative psych ROS   GI/Hepatic negative GI ROS, Neg liver ROS,,,  Endo/Other  negative endocrine ROS    Renal/GU negative Renal ROS  negative genitourinary   Musculoskeletal negative musculoskeletal ROS (+)    Abdominal   Peds negative pediatric ROS (+)  Hematology  (+) Blood dyscrasia, anemia   Anesthesia Other Findings Plt 299  Reproductive/Obstetrics (+) Pregnancy                              Anesthesia Physical Anesthesia Plan  ASA: 2  Anesthesia Plan: Epidural   Post-op Pain Management:    Induction:   PONV Risk Score and Plan: Treatment may vary due to age or medical condition  Airway Management Planned: Natural Airway  Additional Equipment:   Intra-op Plan:   Post-operative Plan:   Informed Consent: I have reviewed the patients History and Physical, chart, labs and discussed the procedure including the risks, benefits and alternatives for the proposed anesthesia with the patient or authorized representative who has indicated his/her understanding and acceptance.       Plan Discussed with: Anesthesiologist  Anesthesia Plan Comments: (Patient identified. Risks, benefits, options discussed with patient including but not limited to bleeding, infection, nerve damage, paralysis, failed block, incomplete pain control, headache, blood pressure changes, nausea,  vomiting, reactions to medication, itching, and post partum back pain. Confirmed with bedside nurse the patient's most recent platelet count. Confirmed with the patient that they are not taking any anticoagulation, have any bleeding history or any family history of bleeding disorders. Patient expressed understanding and wishes to proceed. All questions were answered. )         Anesthesia Quick Evaluation

## 2024-05-02 ENCOUNTER — Other Ambulatory Visit: Payer: Self-pay | Admitting: Obstetrics and Gynecology

## 2024-05-02 LAB — CBC
HCT: 31.9 % — ABNORMAL LOW (ref 36.0–46.0)
Hemoglobin: 9.7 g/dL — ABNORMAL LOW (ref 12.0–15.0)
MCH: 25 pg — ABNORMAL LOW (ref 26.0–34.0)
MCHC: 30.4 g/dL (ref 30.0–36.0)
MCV: 82.2 fL (ref 80.0–100.0)
Platelets: 255 K/uL (ref 150–400)
RBC: 3.88 MIL/uL (ref 3.87–5.11)
RDW: 13.2 % (ref 11.5–15.5)
WBC: 11.1 K/uL — ABNORMAL HIGH (ref 4.0–10.5)
nRBC: 0 % (ref 0.0–0.2)

## 2024-05-02 MED ORDER — ACETAMINOPHEN 325 MG PO TABS
650.0000 mg | ORAL_TABLET | ORAL | 0 refills | Status: AC | PRN
Start: 2024-05-02 — End: ?

## 2024-05-02 MED ORDER — IBUPROFEN 600 MG PO TABS: 600.0000 mg | ORAL_TABLET | Freq: Four times a day (QID) | ORAL | 0 refills | Status: AC

## 2024-05-02 MED ORDER — OXYCODONE HCL 10 MG PO TABS: 5.0000 mg | ORAL_TABLET | Freq: Four times a day (QID) | ORAL | 0 refills | Status: AC | PRN

## 2024-05-02 MED ORDER — BENZOCAINE-MENTHOL 20-0.5 % EX AERO: 1.0000 | INHALATION_SPRAY | CUTANEOUS | 1 refills | Status: AC | PRN

## 2024-05-02 NOTE — Discharge Summary (Signed)
 05/02/24    Postpartum Discharge Summary  Date of Service updated8/14/25     Patient Name: Alice [name] DOB: 27-Jul-1996 MRN: [id number]  Date of admission: 05/01/2024 Delivery date:05/01/2024 Delivering provider: HENRY SLOUGH Date of discharge: 05/02/2024  Admitting diagnosis: Indication for care in labor and delivery, antepartum [O75.9] Intrauterine pregnancy: [redacted]w[redacted]d     Secondary diagnosis:  Principal Problem:   Indication for care in labor and delivery, antepartum  Additional problems: VBAC    Discharge diagnosis: Term Pregnancy Delivered and VBAC                                              Post partum procedures:  Augmentation: N/A Complications: None  Hospital course: Onset of Labor With Vaginal Delivery      28 y.o. yo [id number] at [redacted]w[redacted]d was admitted in Active Labor on 05/01/2024. Labor course was complicated byVBAC  Membrane Rupture Time/Date: 5:35 AM,05/01/2024  Delivery Method:Vaginal, Spontaneous Operative Delivery:N/A Episiotomy: None Lacerations:  None Patient had a postpartum course complicated by SEE ABOVE.  She is ambulating, tolerating a regular diet, passing flatus, and urinating well. Patient is discharged home in stable condition on 05/02/24.  Newborn Data: Birth date:05/01/2024 Birth time:9:58 AM Gender:Female Living status:Living Apgars:8 ,9  Weight:3570 g  Magnesium  Sulfate received: No BMZ received: No Rhophylac:No MMR:No T-DaP:Given postpartum Flu: No RSV Vaccine received: No Transfusion:No Immunizations administered: There is no immunization history for the selected administration types on file for this patient.  Physical exam  Vitals:   05/01/24 1345 05/01/24 2055 05/02/24 0148 05/02/24 0630  BP: 117/73 107/67 110/72 106/79  Pulse: (!) 113 87 98 93  Resp: 18 18 18 18   Temp: 98.7 F (37.1 C) 98.2 F (36.8 C) 97.8 F (36.6 C) 97.7 F (36.5 C)  TempSrc: Oral Oral Oral Oral  SpO2: 100% 99% 99% 100%  Weight:      Height:        General: alert and cooperative Lochia: appropriate Uterine Fundus: firm Incision: N/A DVT Evaluation: No evidence of DVT seen on physical exam. Labs: Lab Results  Component Value Date   WBC 11.1 (H) 05/02/2024   HGB 9.7 (L) 05/02/2024   HCT 31.9 (L) 05/02/2024   MCV 82.2 05/02/2024   PLT 255 05/02/2024       No data to display         Edinburgh Score:    05/02/2024    6:25 AM  Edinburgh Postnatal Depression Scale Screening Tool  I have been able to laugh and see the funny side of things. 0  I have looked forward with enjoyment to things. 0  I have blamed myself unnecessarily when things went wrong. 1  I have been anxious or worried for no good reason. 2  I have felt scared or panicky for no good reason. 2  Things have been getting on top of me. 1  I have been so unhappy that I have had difficulty sleeping. 0  I have felt sad or miserable. 0  I have been so unhappy that I have been crying. 1  The thought of harming myself has occurred to me. 0  Edinburgh Postnatal Depression Scale Total 7      After visit meds:  Allergies as of 05/02/2024   No Known Allergies      Medication List     STOP taking these medications  prenatal multivitamin Tabs tablet       TAKE these medications    acetaminophen  325 MG tablet Commonly known as: Tylenol  Take 2 tablets (650 mg total) by mouth every 4 (four) hours as needed (for pain scale < 4). What changed:  medication strength how much to take when to take this reasons to take this   benzocaine -Menthol  20-0.5 % Aero Commonly known as: DERMOPLAST Apply 1 Application topically as needed for irritation (perineal discomfort).   cholecalciferol  25 MCG (1000 UNIT) tablet Commonly known as: VITAMIN D3 Take 1,000 Units by mouth daily.   ferrous sulfate  324 MG Tbec Take 324 mg by mouth daily with breakfast.   ibuprofen  600 MG tablet Commonly known as: ADVIL  Take 1 tablet (600 mg total) by mouth every 6 (six)  hours. What changed:  when to take this reasons to take this   Oxycodone  HCl 10 MG Tabs Take 0.5 tablets (5 mg total) by mouth every 6 (six) hours as needed (pain scale > 7). What changed:  medication strength how much to take reasons to take this   senna-docusate 8.6-50 MG tablet Commonly known as: Senokot-S Take 2 tablets by mouth at bedtime.         Discharge home in stable condition Infant Feeding: Breast Infant Disposition:home with mother Discharge instruction: per After Visit Summary and Postpartum booklet. Activity: Advance as tolerated. Pelvic rest for 6 weeks.  Diet: routine diet Anticipated Birth Control: Unsure Postpartum Appointment:6 weeks Additional Postpartum F/U:   Future Appointments:No future appointments. Follow up Visit:  Follow-up Information     [facility]. Schedule an appointment as soon as possible for a visit in 6 week(s).   Specialty: Obstetrics and Gynecology Contact information: [address]  [address] [phone]               PT WITH SOME BACK PAIN WILL HAVE EVAL WITH ANESTHESIA     05/02/2024 Ovid DELENA All, MD

## 2024-05-02 NOTE — Lactation Note (Signed)
 This note was copied from a baby's chart. Lactation Consultation Note  Patient Name: Alice [name] Unijb'd Date: 05/02/2024 Age:28 hours Reason for consult: Follow-up assessment;Term  P3, Mother has baby latched when [facility] entered room. She is experienced with breastfeeding and denies questions or concerns. Reviewed engorgement care and monitoring voids/stools.  Maternal Data Has patient been taught Hand Expression?: Yes  Feeding Mother's Current Feeding Choice: Breast Milk  LATCH Score Latch: Grasps breast easily, tongue down, lips flanged, rhythmical sucking.  Audible Swallowing: A few with stimulation  Type of Nipple: Everted at rest and after stimulation  Comfort (Breast/Nipple): Soft / non-tender  Hold (Positioning): No assistance needed to correctly position infant at breast.  LATCH Score: 9  Interventions Interventions: Education  Discharge Discharge Education: Engorgement and breast care;Warning signs for feeding baby Pump: Manual  Consult Status Consult Status: Complete Date: 05/02/24   Shannon Levorn Lemme  RN, IBCLC 05/02/2024, 11:58 AM

## 2024-05-03 LAB — SURGICAL PATHOLOGY

## 2024-05-05 ENCOUNTER — Inpatient Hospital Stay (HOSPITAL_COMMUNITY): Admission: AD | Admit: 2024-05-05 | Source: Home / Self Care | Admitting: Obstetrics and Gynecology

## 2024-05-05 ENCOUNTER — Inpatient Hospital Stay (HOSPITAL_COMMUNITY)

## 2024-05-13 ENCOUNTER — Telehealth (HOSPITAL_COMMUNITY): Payer: Self-pay | Admitting: *Deleted

## 2024-05-13 NOTE — Telephone Encounter (Signed)
 Attempted hospital discharge follow-up call. Left message for patient to return RN call with any questions or concerns. Allean IVAR Carton, RN, 05/13/24, [phone]
